# Patient Record
Sex: Female | Born: 1995 | Race: White | Hispanic: Yes | Marital: Single | Smoking: Never smoker
Health system: Southern US, Community
[De-identification: ages and names within clinical notes are randomized; demographics above are authoritative.]

## PROBLEM LIST (undated history)

## (undated) ENCOUNTER — Ambulatory Visit (HOSPITAL_COMMUNITY): Admission: EM | Payer: Self-pay

## (undated) DIAGNOSIS — Z7289 Other problems related to lifestyle: Secondary | ICD-10-CM

## (undated) DIAGNOSIS — F4310 Post-traumatic stress disorder, unspecified: Secondary | ICD-10-CM

## (undated) DIAGNOSIS — F322 Major depressive disorder, single episode, severe without psychotic features: Secondary | ICD-10-CM

## (undated) DIAGNOSIS — F410 Panic disorder [episodic paroxysmal anxiety] without agoraphobia: Secondary | ICD-10-CM

## (undated) DIAGNOSIS — H539 Unspecified visual disturbance: Secondary | ICD-10-CM

## (undated) DIAGNOSIS — T7422XA Child sexual abuse, confirmed, initial encounter: Secondary | ICD-10-CM

## (undated) DIAGNOSIS — F419 Anxiety disorder, unspecified: Secondary | ICD-10-CM

## (undated) DIAGNOSIS — J45909 Unspecified asthma, uncomplicated: Secondary | ICD-10-CM

## (undated) HISTORY — PX: WISDOM TOOTH EXTRACTION: SHX21

---
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
## 2017-01-14 ENCOUNTER — Encounter: Payer: Self-pay | Admitting: Obstetrics & Gynecology

## 2017-01-14 ENCOUNTER — Ambulatory Visit (INDEPENDENT_AMBULATORY_CARE_PROVIDER_SITE_OTHER): Payer: Medicaid Other | Admitting: Obstetrics & Gynecology

## 2017-01-14 VITALS — BP 117/76 | HR 81 | Ht 66.0 in | Wt 183.3 lb

## 2017-01-14 DIAGNOSIS — N83202 Unspecified ovarian cyst, left side: Secondary | ICD-10-CM | POA: Diagnosis not present

## 2017-01-14 DIAGNOSIS — Z975 Presence of (intrauterine) contraceptive device: Secondary | ICD-10-CM | POA: Insufficient documentation

## 2017-01-14 DIAGNOSIS — Z708 Other sex counseling: Secondary | ICD-10-CM

## 2017-01-14 NOTE — Patient Instructions (Addendum)
Ovarian Cyst  An ovarian cyst is a fluid-filled sac that forms on an ovary. The ovaries are small organs that produce eggs in women. Various types of cysts can form on the ovaries. Some may cause symptoms and require treatment. Most ovarian cysts go away on their own, are not cancerous (are benign), and do not cause problems. Common types of ovarian cysts include:  Functional (follicle) cysts.  Occur during the menstrual cycle, and usually go away with the next menstrual cycle if you do not get pregnant.  Usually cause no symptoms but can cause occasional pain.  No intervention needed unless they are recurrent.  Endometriomas.  Are cysts that form from the tissue that lines the uterus (endometrium).  Are sometimes called "chocolate cysts" because they become filled with blood that turns brown.  Can cause pain in the lower abdomen during intercourse and during your period.  Cystadenoma cysts.  Develop from cells on the outside surface of the ovary.  Can get very large and cause lower abdomen pain and pain with intercourse.  Can cause severe pain if they twist or break open (rupture).  Dermoid cysts.  Are sometimes found in both ovaries.  May contain different kinds of body tissue, such as skin, teeth, hair, or cartilage.  Usually do not cause symptoms unless they get very big.  Theca lutein cysts.  Occur when too much of a certain hormone (human chorionic gonadotropin) is produced and overstimulates the ovaries to produce an egg.  Are most common after having procedures used to assist with the conception of a baby (in vitro fertilization). What are the causes? Ovarian cysts may be caused by:  Ovarian hyperstimulation syndrome. This is a condition that can develop from taking fertility medicines. It causes multiple large ovarian cysts to form.  Polycystic ovarian syndrome (PCOS). This is a common hormonal disorder that can cause ovarian cysts, as well as problems with your  period or fertility. What increases the risk? The following factors may make you more likely to develop ovarian cysts:  Being overweight or obese.  Taking fertility medicines.  Taking certain forms of hormonal birth control.  Smoking. What are the signs or symptoms? Many ovarian cysts do not cause symptoms. If symptoms are present, they may include:  Pelvic pain or pressure.  Pain in the lower abdomen.  Pain during sex.  Abdominal swelling.  Abnormal menstrual periods.  Increasing pain with menstrual periods. How is this diagnosed? These cysts are commonly found during a routine pelvic exam. You may have tests to find out more about the cyst, such as:  Ultrasound.  X-ray of the pelvis.  CT scan.  MRI.  Blood tests. How is this treated? Many ovarian cysts go away on their own without treatment. Your health care provider may want to check your cyst regularly for 2-3 months to see if it changes. If you are in menopause, it is especially important to have your cyst monitored closely because menopausal women have a higher rate of ovarian cancer. When treatment is needed, it may include:  Medicines to help relieve pain.  A procedure to drain the cyst (aspiration).  Surgery to remove the whole cyst.  Hormone treatment or birth control pills. These methods are sometimes used to help dissolve a cyst. Follow these instructions at home:  Take over-the-counter and prescription medicines only as told by your health care provider.  Do not drive or use heavy machinery while taking prescription pain medicine.  Get regular pelvic exams and Pap tests  as often as told by your health care provider.  Return to your normal activities as told by your health care provider. Ask your health care provider what activities are safe for you.  Do not use any products that contain nicotine or tobacco, such as cigarettes and e-cigarettes. If you need help quitting, ask your health care  provider.  Keep all follow-up visits as told by your health care provider. This is important. Contact a health care provider if:  Your periods are late, irregular, or painful, or they stop.  You have pelvic pain that does not go away.  You have pressure on your bladder or trouble emptying your bladder completely.  You have pain during sex.  You have any of the following in your abdomen:  A feeling of fullness.  Pressure.  Discomfort.  Pain that does not go away.  Swelling.  You feel generally ill.  You become constipated.  You lose your appetite.  You develop severe acne.  You start to have more body hair and facial hair.  You are gaining weight or losing weight without changing your exercise and eating habits.  You think you may be pregnant. Get help right away if:  You have abdominal pain that is severe or gets worse.  You cannot eat or drink without vomiting.  You suddenly develop a fever.  Your menstrual period is much heavier than usual. This information is not intended to replace advice given to you by your health care provider. Make sure you discuss any questions you have with your health care provider. Document Released: 10/08/2005 Document Revised: 04/27/2016 Document Reviewed: 03/11/2016 Elsevier Interactive Patient Education  2017 Elsevier Inc.   HPV (Human Papillomavirus) Vaccine: What You Need to Know 1. Why get vaccinated? HPV vaccine prevents infection with human papillomavirus (HPV) types that are associated with many cancers, including:  cervical cancer in females,  vaginal and vulvar cancers in females,  anal cancer in females and males,  throat cancer in females and males, and  penile cancer in males. In addition, HPV vaccine prevents infection with HPV types that cause genital warts in both females and males. In the [place] about 12,000 women get cervical cancer every year, and about 4,000 women die from it. HPV vaccine can prevent  most of these cases of cervical cancer. Vaccination is not a substitute for cervical cancer screening. This vaccine does not protect against all HPV types that can cause cervical cancer. Women should still get regular Pap tests.  HPV infection usually comes from sexual contact, and most people will become infected at some point in their life. About 14 million Americans, including teens, get infected every year. Most infections will go away on their own and not cause serious problems. But thousands of women and men get cancer and other diseases from HPV. 2. HPV vaccine HPV vaccine is approved by FDA and is recommended by CDC for both males and females. It is routinely given at 6 or 19 years of age, but it may be given beginning at age 2 years through age 62 years. Most adolescents 9 through 19 years of age should get HPV vaccine as a two-dose series with the doses separated by 6-12 months. People who start HPV vaccination at 19 years of age and older should get the vaccine as a three-dose series with the second dose given 1-2 months after the first dose and the third dose given 6 months after the first dose. There are several exceptions to these age recommendations.  Your health care provider can give you more information. 3. Some people should not get this vaccine  Anyone who has had a severe (life-threatening) allergic reaction to a dose of HPV vaccine should not get another dose.  Anyone who has a severe (life threatening) allergy to any component of HPV vaccine should not get the vaccine.  Tell your doctor if you have any severe allergies that you know of, including a severe allergy to yeast.  HPV vaccine is not recommended for pregnant women. If you learn that you were pregnant when you were vaccinated, there is no reason to expect any problems for you or your baby. Any woman who learns she was pregnant when she got HPV vaccine is encouraged to contact the manufacturer's registry for HPV  vaccination during pregnancy at [phone]. Women who are breastfeeding may be vaccinated.  If you have a mild illness, such as a cold, you can probably get the vaccine today. If you are moderately or severely ill, you should probably wait until you recover. Your doctor can advise you. 4. Risks of a vaccine reaction With any medicine, including vaccines, there is a chance of side effects. These are usually mild and go away on their own, but serious reactions are also possible. Most people who get HPV vaccine do not have any serious problems with it. Mild or moderate problems following HPV vaccine:   Reactions in the arm where the shot was given:  Soreness (about 9 people in 10)  Redness or swelling (about 1 person in 3)  Fever:  Mild (100F) (about 1 person in 10)  Moderate (102F) (about 1 person in 65)  Other problems:  Headache (about 1 person in 3) Problems that could happen after any injected vaccine:   People sometimes faint after a medical procedure, including vaccination. Sitting or lying down for about 15 minutes can help prevent fainting, and injuries caused by a fall. Tell your doctor if you feel dizzy, or have vision changes or ringing in the ears.  Some people get severe pain in the shoulder and have difficulty moving the arm where a shot was given. This happens very rarely.  Any medication can cause a severe allergic reaction. Such reactions from a vaccine are very rare, estimated at about 1 in a million doses, and would happen within a few minutes to a few hours after the vaccination. As with any medicine, there is a very remote chance of a vaccine causing a serious injury or death. The safety of vaccines is always being monitored. For more information, visit: http://floyd.org/. 5. What if there is a serious reaction? What should I look for?  Look for anything that concerns you, such as signs of a severe allergic reaction, very high fever, or unusual  behavior. Signs of a severe allergic reaction can include hives, swelling of the face and throat, difficulty breathing, a fast heartbeat, dizziness, and weakness. These would usually start a few minutes to a few hours after the vaccination. What should I do?  If you think it is a severe allergic reaction or other emergency that can't wait, call 9-1-1 or get to the nearest hospital. Otherwise, call your doctor. Afterward, the reaction should be reported to the Vaccine Adverse Event Reporting System (VAERS). Your doctor should file this report, or you can do it yourself through the VAERS web site at www.vaers.LAgents.no, or by calling 1-(805) 028-9629. VAERS does not give medical advice.  6. The National Vaccine Injury Compensation Program The [employer] Vaccine Injury Compensation  Program (VICP) is a Stage manager that was created to compensate people who may have been injured by certain vaccines. Persons who believe they may have been injured by a vaccine can learn about the program and about filing a claim by calling 1-(413)267-5310 or visiting the VICP website at SpiritualWord.at. There is a time limit to file a claim for compensation. 7. How can I learn more?  Ask your health care provider. He or she can give you the vaccine package insert or suggest other sources of information.  Call your local or state health department.  Contact the Centers for Disease Control and Prevention (CDC):  Call (414)228-3553 (1-800-CDC-INFO) or  Visit CDC's website at RunningConvention.de Vaccine Information Statement, HPV Vaccine (09/23/2015) This information is not intended to replace advice given to you by your health care provider. Make sure you discuss any questions you have with your health care provider. Document Released: 05/05/2014 Document Revised: 06/28/2016 Document Reviewed: 06/28/2016 Elsevier Interactive Patient Education  2017 Elsevier Inc.   Gracias por inscribirse en MyChart. Siga  las instrucciones a continuacin para acceder de Wellsite geologist segura a su registro medico en lnea. MyChart le permite enviarle mensajes a su mdico, consultar los resultados de sus exmenes, Company secretary sus citas y mucho ms.  Cmo me inscribo? 1. En su navegador de Internet, vaya a Nurse, learning disability (barra de direcciones) e ingrese https://mychart.PackageNews.de. 2. Haga clic en el enlace de Sign Up Now (Registrarse ahora) en la casilla de Sign In (Inicio de sesin). Ver la pgina de New Member Sign Up (Registro de Cornish). 3. Ingrese su Access Code (cdigo de Teacher, adult education) de MyChart exactamente como aparece a continuacin. No tendr [employer] cdigo despus de completar el proceso de Engineer, maintenance (IT). Si no se registra antes de la fecha de vencimiento, debe solicitar un nuevo cdigo.  Cdigo de acceso de MyChart: [id number] Expires: 02/15/2017 12:38 AM  4. Ingrese su nmero de Seguro Social ([id number]) y fecha de nacimiento (mm/dd/aaa) como se indica y haga clic en  Submit Art gallery manager). Se le llevar a la siguiente pgina de registro. 5. Create a MyChart ID (Crear una identificacin de MyChart). Esta ser su identificacin de inicio de sesin de MyChart y no se puede cambiar, as que [employer] que sea segura y fcil de Clinical research associate. 6. Crear una contrasea de MyChart. Puede modificar su contrasea en cualquier momento. 7. Ingrese su pregunta y respuesta para restablecer su contrasea. Esto se puede usar ms adelante si olvida su contrasea.  8. Marcelino Freestone su direccin de correo electrnico. Recibir un correo electrnico cuando haya nueva informacin disponible en MyChart. 9. Haga clic en Sign Up (Registrarse). Ahora puede ver su registro mdico.  Informacin adicional Recuerde, MyChart NO se puede usar para necesidades urgentes. Para emergencias mdicas, marque el [phone].

## 2017-01-14 NOTE — Progress Notes (Signed)
Patient declines seeing Jamie today. 
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
## 2017-06-30 ENCOUNTER — Emergency Department (HOSPITAL_COMMUNITY)
Admission: EM | Admit: 2017-06-30 | Discharge: 2017-07-01 | Disposition: A | Discharge status: Home / Self Care | Payer: Medicaid Other | Attending: Emergency Medicine | Admitting: Emergency Medicine

## 2017-06-30 ENCOUNTER — Encounter (HOSPITAL_COMMUNITY): Payer: Self-pay | Admitting: *Deleted

## 2017-06-30 DIAGNOSIS — R0789 Other chest pain: Secondary | ICD-10-CM | POA: Diagnosis not present

## 2017-06-30 DIAGNOSIS — J45909 Unspecified asthma, uncomplicated: Secondary | ICD-10-CM | POA: Insufficient documentation

## 2017-06-30 DIAGNOSIS — R079 Chest pain, unspecified: Secondary | ICD-10-CM | POA: Diagnosis present

## 2017-06-30 DIAGNOSIS — Z87891 Personal history of nicotine dependence: Secondary | ICD-10-CM | POA: Diagnosis not present

## 2017-06-30 DIAGNOSIS — R059 Cough, unspecified: Secondary | ICD-10-CM

## 2017-06-30 DIAGNOSIS — R05 Cough: Secondary | ICD-10-CM | POA: Diagnosis not present

## 2017-06-30 NOTE — ED Triage Notes (Signed)
Pt c/o left chest pain under left breast that started while she was lying down; pt states she has some sob with the pain

## 2017-07-01 ENCOUNTER — Emergency Department (HOSPITAL_COMMUNITY): Payer: Medicaid Other

## 2017-07-01 LAB — COMPREHENSIVE METABOLIC PANEL
ALBUMIN: 4.2 g/dL (ref 3.5–5.0)
ALK PHOS: 87 U/L (ref 38–126)
ALT: 13 U/L — ABNORMAL LOW (ref 14–54)
ANION GAP: 11 (ref 5–15)
AST: 20 U/L (ref 15–41)
BILIRUBIN TOTAL: 0.2 mg/dL — AB (ref 0.3–1.2)
BUN: 11 mg/dL (ref 6–20)
CO2: 26 mmol/L (ref 22–32)
Calcium: 9.6 mg/dL (ref 8.9–10.3)
Chloride: 105 mmol/L (ref 101–111)
Creatinine, Ser: 0.62 mg/dL (ref 0.44–1.00)
GFR calc Af Amer: >60 mL/min (ref >60)
GFR calc non Af Amer: >60 mL/min (ref >60)
GLUCOSE: 143 mg/dL — AB (ref 65–99)
Potassium: 3.8 mmol/L (ref 3.5–5.1)
Sodium: 142 mmol/L (ref 135–145)
Total Protein: 7.4 g/dL (ref 6.5–8.1)

## 2017-07-01 LAB — CBC WITH DIFFERENTIAL/PLATELET
BASOS ABS: 0 10*3/uL (ref 0.0–0.1)
Basophils Relative: 0 %
Eosinophils Absolute: 0.1 10*3/uL (ref 0.0–0.7)
Eosinophils Relative: 1 %
HEMATOCRIT: 40.1 % (ref 36.0–46.0)
Hemoglobin: 13.2 g/dL (ref 12.0–15.0)
LYMPHS PCT: 45 %
Lymphs Abs: 4.7 10*3/uL — ABNORMAL HIGH (ref 0.7–4.0)
MCH: 29.6 pg (ref 26.0–34.0)
MCHC: 32.9 g/dL (ref 30.0–36.0)
MCV: 89.9 fL (ref 78.0–100.0)
MONO ABS: 0.5 10*3/uL (ref 0.1–1.0)
Monocytes Relative: 5 %
NEUTROS ABS: 5 10*3/uL (ref 1.7–7.7)
Neutrophils Relative %: 49 %
Platelets: 296 10*3/uL (ref 150–400)
RBC: 4.46 MIL/uL (ref 3.87–5.11)
RDW: 12.6 % (ref 11.5–15.5)
WBC: 10.4 10*3/uL (ref 4.0–10.5)

## 2017-07-01 LAB — D-DIMER, QUANTITATIVE: D-Dimer, Quant: 0.41 ug/mL-FEU (ref 0.00–0.50)

## 2017-07-01 LAB — TROPONIN I: Troponin I: <0.03 ng/mL (ref <0.03)

## 2017-07-01 MED ORDER — IPRATROPIUM-ALBUTEROL 0.5-2.5 (3) MG/3ML IN SOLN
3.0000 mL | Freq: Once | RESPIRATORY_TRACT | Status: AC
  Administered 2017-07-01: 3 mL via RESPIRATORY_TRACT
  Filled 2017-07-01: qty 3

## 2017-07-01 MED ORDER — ALBUTEROL SULFATE (2.5 MG/3ML) 0.083% IN NEBU
2.5000 mg | INHALATION_SOLUTION | Freq: Once | RESPIRATORY_TRACT | Status: AC
  Administered 2017-07-01: 2.5 mg via RESPIRATORY_TRACT
  Filled 2017-07-01: qty 3

## 2017-07-01 MED ORDER — ALBUTEROL SULFATE HFA 108 (90 BASE) MCG/ACT IN AERS
2.0000 | INHALATION_SPRAY | Freq: Four times a day (QID) | RESPIRATORY_TRACT | Status: DC | PRN
  Administered 2017-07-01: 2 via RESPIRATORY_TRACT
  Filled 2017-07-01: qty 6.7

## 2017-07-01 MED ORDER — IPRATROPIUM BROMIDE 0.02 % IN SOLN: 0.5000 mg | Freq: Once | RESPIRATORY_TRACT | Status: DC

## 2017-07-01 MED ORDER — ALBUTEROL SULFATE (2.5 MG/3ML) 0.083% IN NEBU: 5.0000 mg | INHALATION_SOLUTION | Freq: Once | RESPIRATORY_TRACT | Status: DC

## 2017-07-01 MED ORDER — IBUPROFEN 400 MG PO TABS
600.0000 mg | ORAL_TABLET | Freq: Once | ORAL | Status: AC
  Administered 2017-07-01: 600 mg via ORAL
  Filled 2017-07-01: qty 2

## 2017-07-01 MED ORDER — AEROCHAMBER Z-STAT PLUS/MEDIUM MISC
1.0000 | Freq: Once | Status: AC
  Administered 2017-07-01: 1

## 2017-07-01 NOTE — Discharge Instructions (Signed)
Use ice and heat for comfort. Take ibuprofen 600 mg 4 times a day for your chest wall pain as needed. Use the inhaler for shortness of breath. You can take mucinex DM OTC for cough.
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
## 2017-07-19 ENCOUNTER — Ambulatory Visit (INDEPENDENT_AMBULATORY_CARE_PROVIDER_SITE_OTHER): Payer: Medicaid Other | Admitting: Family Medicine

## 2017-07-19 ENCOUNTER — Encounter: Payer: Self-pay | Admitting: Family Medicine

## 2017-07-19 ENCOUNTER — Telehealth: Payer: Self-pay

## 2017-07-19 VITALS — BP 113/73 | HR 100 | Temp 98.2°F | Resp 14 | Ht 66.0 in | Wt 191.4 lb

## 2017-07-19 DIAGNOSIS — J4540 Moderate persistent asthma, uncomplicated: Secondary | ICD-10-CM | POA: Diagnosis not present

## 2017-07-19 DIAGNOSIS — Z23 Encounter for immunization: Secondary | ICD-10-CM | POA: Diagnosis not present

## 2017-07-19 MED ORDER — FLUTICASONE PROPIONATE (INHAL) 50 MCG/BLIST IN AEPB: 1.0000 | INHALATION_SPRAY | Freq: Two times a day (BID) | RESPIRATORY_TRACT | 12 refills | Status: DC

## 2017-07-19 MED ORDER — ALBUTEROL SULFATE (2.5 MG/3ML) 0.083% IN NEBU: 2.5000 mg | INHALATION_SOLUTION | Freq: Four times a day (QID) | RESPIRATORY_TRACT | 1 refills | Status: DC | PRN

## 2017-07-19 MED ORDER — BUDESONIDE-FORMOTEROL FUMARATE 80-4.5 MCG/ACT IN AERO: 2.0000 | INHALATION_SPRAY | Freq: Two times a day (BID) | RESPIRATORY_TRACT | 3 refills | Status: DC

## 2017-07-19 MED ORDER — PREDNISONE 20 MG PO TABS: 20.0000 mg | ORAL_TABLET | Freq: Every day | ORAL | 0 refills | Status: DC

## 2017-07-19 MED FILL — ?PREDNISONE 20MG TABLET: 20 | 10 days supply | Qty: 10 | Fill #0

## 2017-07-19 MED FILL — ALBUTEROL 0.083% INHAL SOLN: (2.5 MG/3ML | 15 days supply | Qty: 180 | Fill #0
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
## 2017-07-19 NOTE — Telephone Encounter (Signed)
Per Medicaid coverage list, Symbicort was preferred. Discontinued and prescribed fluticasone which is also listed as preferred.   Godfrey Pick. Tiburcio Pea, MSN, FNP-C [facility]  [address] [place], [place] [address] [phone]

## 2017-07-19 NOTE — Patient Instructions (Addendum)
Take all medication as directed! Follow-up in 2 weeks for evaluation of symptoms.   Asthma, Adult Asthma is a condition of the lungs in which the airways tighten and narrow. Asthma can make it hard to breathe. Asthma cannot be cured, but medicine and lifestyle changes can help control it. Asthma may be started (triggered) by:  Animal skin flakes (dander).  Dust.  Cockroaches.  Pollen.  Mold.  Smoke.  Cleaning products.  Hair sprays or aerosol sprays.  Paint fumes or strong smells.  Cold air, weather changes, and winds.  Crying or laughing hard.  Stress.  Certain medicines or drugs.  Foods, such as dried fruit, potato chips, and sparkling grape juice.  Infections or conditions (colds, flu).  Exercise.  Certain medical conditions or diseases.  Exercise or tiring activities.  Follow these instructions at home:  Take medicine as told by your doctor.  Use a peak flow meter as told by your doctor. A peak flow meter is a tool that measures how well the lungs are working.  Record and keep track of the peak flow meter's readings.  Understand and use the asthma action plan. An asthma action plan is a written plan for taking care of your asthma and treating your attacks.  To help prevent asthma attacks: ? Do not smoke. Stay away from secondhand smoke. ? Change your heating and air conditioning filter often. ? Limit your use of fireplaces and wood stoves. ? Get rid of pests (such as roaches and mice) and their droppings. ? Throw away plants if you see mold on them. ? Clean your floors. Dust regularly. Use cleaning products that do not smell. ? Have someone vacuum when you are not home. Use a vacuum cleaner with a HEPA filter if possible. ? Replace carpet with wood, tile, or vinyl flooring. Carpet can trap animal skin flakes and dust. ? Use allergy-proof pillows, mattress covers, and box spring covers. ? Wash bed sheets and blankets every week in hot water and dry them  in a dryer. ? Use blankets that are made of polyester or cotton. ? Clean bathrooms and kitchens with bleach. If possible, have someone repaint the walls in these rooms with mold-resistant paint. Keep out of the rooms that are being cleaned and painted. ? Wash hands often. Contact a doctor if:  You have make a whistling sound when breaking (wheeze), have shortness of breath, or have a cough even if taking medicine to prevent attacks.  The colored mucus you cough up (sputum) is thicker than usual.  The colored mucus you cough up changes from clear or white to yellow, green, gray, or bloody.  You have problems from the medicine you are taking such as: ? A rash. ? Itching. ? Swelling. ? Trouble breathing.  You need reliever medicines more than 2-3 times a week.  Your peak flow measurement is still at 50-79% of your personal best after following the action plan for 1 hour.  You have a fever. Get help right away if:  You seem to be worse and are not responding to medicine during an asthma attack.  You are short of breath even at rest.  You get short of breath when doing very little activity.  You have trouble eating, drinking, or talking.  You have chest pain.  You have a fast heartbeat.  Your lips or fingernails start to turn blue.  You are light-headed, dizzy, or faint.  Your peak flow is less than 50% of your personal best. This information  is not intended to replace advice given to you by your health care provider. Make sure you discuss any questions you have with your health care provider. Document Released: 03/26/2008 Document Revised: 03/15/2016 Document Reviewed: 05/07/2013 Elsevier Interactive Patient Education  2017 [employer].

## 2017-08-02 ENCOUNTER — Ambulatory Visit: Payer: Medicaid Other | Admitting: Family Medicine

## 2018-08-22 ENCOUNTER — Emergency Department (HOSPITAL_COMMUNITY)
Admission: EM | Admit: 2018-08-22 | Discharge: 2018-08-22 | Disposition: A | Discharge status: Home / Self Care | Payer: Self-pay | Attending: Emergency Medicine | Admitting: Emergency Medicine

## 2018-08-22 ENCOUNTER — Encounter (HOSPITAL_COMMUNITY): Payer: Self-pay | Admitting: Emergency Medicine

## 2018-08-22 ENCOUNTER — Emergency Department (HOSPITAL_COMMUNITY): Payer: Self-pay

## 2018-08-22 ENCOUNTER — Other Ambulatory Visit: Payer: Self-pay

## 2018-08-22 DIAGNOSIS — R070 Pain in throat: Secondary | ICD-10-CM | POA: Insufficient documentation

## 2018-08-22 DIAGNOSIS — R0789 Other chest pain: Secondary | ICD-10-CM | POA: Insufficient documentation

## 2018-08-22 DIAGNOSIS — J45909 Unspecified asthma, uncomplicated: Secondary | ICD-10-CM | POA: Insufficient documentation

## 2018-08-22 DIAGNOSIS — F1210 Cannabis abuse, uncomplicated: Secondary | ICD-10-CM | POA: Insufficient documentation

## 2018-08-22 DIAGNOSIS — R0981 Nasal congestion: Secondary | ICD-10-CM | POA: Insufficient documentation

## 2018-08-22 DIAGNOSIS — Z87891 Personal history of nicotine dependence: Secondary | ICD-10-CM | POA: Insufficient documentation

## 2018-08-22 DIAGNOSIS — R05 Cough: Secondary | ICD-10-CM | POA: Insufficient documentation

## 2018-08-22 DIAGNOSIS — R071 Chest pain on breathing: Secondary | ICD-10-CM | POA: Insufficient documentation

## 2018-08-22 DIAGNOSIS — F410 Panic disorder [episodic paroxysmal anxiety] without agoraphobia: Secondary | ICD-10-CM | POA: Insufficient documentation

## 2018-08-22 NOTE — Discharge Instructions (Signed)
You have been diagnosed by your caregiver as having chest wall pain. °SEEK IMMEDIATE MEDICAL ATTENTION IF: °You develop a fever.  °Your chest pains become severe or intolerable.  °You develop new, unexplained symptoms (problems).  °You develop shortness of breath, nausea, vomiting, sweating or feel light headed.  °You develop a new cough or you cough up blood. ° °

## 2018-08-22 NOTE — ED Notes (Signed)
Patient verbalizes understanding of discharge instructions. Opportunity for questioning and answers were provided. Pt discharged from ED. 

## 2018-08-22 NOTE — ED Triage Notes (Signed)
Patient here from home, states that she started having a panic attack at home and became short of breath.  She states she is feeling numb in hands, feet and legs and arms.  Patient was able to calm and now breathing with no distress.  BS clear bilaterally.

## 2018-08-22 NOTE — ED Notes (Signed)
ED Provider at bedside. 
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
## 2019-05-11 ENCOUNTER — Encounter (HOSPITAL_COMMUNITY): Payer: Self-pay

## 2019-05-11 ENCOUNTER — Emergency Department (HOSPITAL_COMMUNITY)
Admission: EM | Admit: 2019-05-11 | Discharge: 2019-05-11 | Disposition: A | Discharge status: Home / Self Care | Payer: Self-pay | Attending: Emergency Medicine | Admitting: Emergency Medicine

## 2019-05-11 ENCOUNTER — Ambulatory Visit (HOSPITAL_COMMUNITY): Admission: EM | Admit: 2019-05-11 | Discharge: 2019-05-11 | Discharge status: Against Medical Advice | Payer: Self-pay

## 2019-05-11 ENCOUNTER — Other Ambulatory Visit: Payer: Self-pay

## 2019-05-11 DIAGNOSIS — F32A Depression, unspecified: Secondary | ICD-10-CM

## 2019-05-11 DIAGNOSIS — Z87891 Personal history of nicotine dependence: Secondary | ICD-10-CM | POA: Insufficient documentation

## 2019-05-11 DIAGNOSIS — F329 Major depressive disorder, single episode, unspecified: Secondary | ICD-10-CM | POA: Insufficient documentation

## 2019-05-11 DIAGNOSIS — F4310 Post-traumatic stress disorder, unspecified: Secondary | ICD-10-CM | POA: Insufficient documentation

## 2019-05-11 DIAGNOSIS — F1210 Cannabis abuse, uncomplicated: Secondary | ICD-10-CM | POA: Insufficient documentation

## 2019-05-11 DIAGNOSIS — G4700 Insomnia, unspecified: Secondary | ICD-10-CM | POA: Insufficient documentation

## 2019-05-11 DIAGNOSIS — F321 Major depressive disorder, single episode, moderate: Secondary | ICD-10-CM | POA: Insufficient documentation

## 2019-05-11 DIAGNOSIS — F419 Anxiety disorder, unspecified: Secondary | ICD-10-CM | POA: Insufficient documentation

## 2019-05-11 LAB — I-STAT BETA HCG BLOOD, ED (MC, WL, AP ONLY): I-stat hCG, quantitative: <5 m[IU]/mL (ref <5)

## 2019-05-11 LAB — CBC
HCT: 42.1 % (ref 36.0–46.0)
Hemoglobin: 13.7 g/dL (ref 12.0–15.0)
MCH: 30.6 pg (ref 26.0–34.0)
MCHC: 32.5 g/dL (ref 30.0–36.0)
MCV: 94 fL (ref 80.0–100.0)
Platelets: 339 10*3/uL (ref 150–400)
RBC: 4.48 MIL/uL (ref 3.87–5.11)
RDW: 12.6 % (ref 11.5–15.5)
WBC: 7.5 10*3/uL (ref 4.0–10.5)
nRBC: 0 % (ref 0.0–0.2)

## 2019-05-11 LAB — ETHANOL: Alcohol, Ethyl (B): <10 mg/dL (ref <10)

## 2019-05-11 LAB — COMPREHENSIVE METABOLIC PANEL
ALT: 15 U/L (ref 0–44)
AST: 17 U/L (ref 15–41)
Albumin: 4.3 g/dL (ref 3.5–5.0)
Alkaline Phosphatase: 75 U/L (ref 38–126)
Anion gap: 9 (ref 5–15)
BUN: 11 mg/dL (ref 6–20)
CO2: 27 mmol/L (ref 22–32)
Calcium: 9.6 mg/dL (ref 8.9–10.3)
Chloride: 102 mmol/L (ref 98–111)
Creatinine, Ser: 0.69 mg/dL (ref 0.44–1.00)
GFR calc Af Amer: >60 mL/min (ref >60)
GFR calc non Af Amer: >60 mL/min (ref >60)
Glucose, Bld: 107 mg/dL — ABNORMAL HIGH (ref 70–99)
Potassium: 4.5 mmol/L (ref 3.5–5.1)
Sodium: 138 mmol/L (ref 135–145)
Total Bilirubin: 0.4 mg/dL (ref 0.3–1.2)
Total Protein: 6.9 g/dL (ref 6.5–8.1)

## 2019-05-11 LAB — RAPID URINE DRUG SCREEN, HOSP PERFORMED
Amphetamines: NOT DETECTED
Barbiturates: NOT DETECTED
Benzodiazepines: NOT DETECTED
Cocaine: NOT DETECTED
Opiates: NOT DETECTED
Tetrahydrocannabinol: NOT DETECTED

## 2019-05-11 LAB — ACETAMINOPHEN LEVEL: Acetaminophen (Tylenol), Serum: <10 ug/mL — ABNORMAL LOW (ref 10–30)

## 2019-05-11 LAB — SALICYLATE LEVEL: Salicylate Lvl: <7 mg/dL (ref 2.8–30.0)

## 2019-05-11 MED ORDER — HYDROXYZINE HCL 25 MG PO TABS: 25.0000 mg | ORAL_TABLET | Freq: Three times a day (TID) | ORAL | 0 refills | Status: DC

## 2019-05-11 NOTE — ED Triage Notes (Signed)
Pt reports increased anxiety and depression over the past 2 months. Pt used to take medications for these problems but has been off for about 2-3 years. Pt denies SI/HI, no AH/VH. Pt calm and cooperative in triage.
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
## 2019-05-11 NOTE — ED Notes (Signed)
Interpreter and TTS machine placed in room.
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
## 2019-05-11 NOTE — Discharge Instructions (Addendum)
You have been seen today for depression and anxiety. Please read and follow all provided instructions.   1. Medications: vistaril (for anxiety - this medication may cause drowsiness), usual home medications 2. Treatment: rest, drink plenty of fluids 3. Follow Up: Please follow up with your primary doctor in 2-7 days for discussion of your diagnoses and further evaluation after today's visit; if you do not have a primary care doctor use the resource guide provided to find one; Please return to the ER for any new or worsening symptoms. Please obtain all of your results from medical records or have your doctors office obtain the results - share them with your doctor - you should be seen at your doctors office. Call today to arrange your follow up.   Take medications as prescribed. Please review all of the medicines and only take them if you do not have an allergy to them. Return to the emergency room for worsening condition or new concerning symptoms. Follow up with your regular doctor. If you don't have a regular doctor use one of the numbers below to establish a primary care doctor.  Please be aware that if you are taking birth control pills, taking other prescriptions, ESPECIALLY ANTIBIOTICS may make the birth control ineffective - if this is the case, either do not engage in sexual activity or use alternative methods of birth control such as condoms until you have finished the medicine and your family doctor says it is OK to restart them. If you are on a blood thinner such as COUMADIN, be aware that any other medicine that you take may cause the coumadin to either work too much, or not enough - you should have your coumadin level rechecked in next 7 days if this is the case.  ?  It is also a possibility that you have an allergic reaction to any of the medicines that you have been prescribed - Everybody reacts differently to medications and while MOST people have no trouble with most medicines, you may  have a reaction such as nausea, vomiting, rash, swelling, shortness of breath. If this is the case, please stop taking the medicine immediately and contact your physician.  ?  You should return to the ER if you develop severe or worsening symptoms.   Emergency Department Resource Guide 1) Find a Doctor and Pay Out of Pocket Although you won't have to find out who is covered by your insurance plan, it is a good idea to ask around and get recommendations. You will then need to call the office and see if the doctor you have chosen will accept you as a new patient and what types of options they offer for patients who are self-pay. Some doctors offer discounts or will set up payment plans for their patients who do not have insurance, but you will need to ask so you aren't surprised when you get to your appointment.  2) Contact Your Local Health Department Not all health departments have doctors that can see patients for sick visits, but many do, so it is worth a call to see if yours does. If you don't know where your local health department is, you can check in your phone book. The CDC also has a tool to help you locate your state's health department, and many state websites also have listings of all of their local health departments.  3) Find a [facility] If your illness is not likely to be very severe or complicated, you may want to try  a walk in clinic. These are popping up all over the country in pharmacies, drugstores, and shopping centers. They're usually staffed by nurse practitioners or physician assistants that have been trained to treat common illnesses and complaints. They're usually fairly quick and inexpensive. However, if you have serious medical issues or chronic medical problems, these are probably not your best option.  No Primary Care Doctor: Call Health Connect at  [phone] - they can help you locate a primary care doctor that  accepts your insurance, provides certain services,  etc. Physician Referral Service- [phone]  Chronic Pain Problems: Organization         Address  Phone   Notes  [facility]  [phone] Patients need to be referred by their primary care doctor.   Medication Assistance: Organization         Address  Phone   Notes  [facility] Medication [facility] [address] [place], [place] [address] [phone] --Must be a resident of [facility] -- Must have NO insurance coverage whatsoever (no Medicaid/ Medicare, etc.) -- The pt. MUST have a primary care doctor that directs their care regularly and follows them in the community   [facility]  [phone]   [employer]  [phone]    Agencies that provide inexpensive medical care: Organization         Address  Phone   Notes  [place]  [phone]   [facility]    [phone]   [facility] [place], [place] [address] [phone]   [place] [address], [place] [phone]   [facility]    [phone]   [facility]    [phone]   [place] [address], [place] Phone:  [phone], Fax:  [phone] Hours of Operation:  9 am - 6 pm, M-F.  Also accepts Medicaid/Medicare and self-pay.  [facility] for [place] [address], [address], [place] Phone: [phone], Fax: [phone]. Hours of Operation:  8:30 am - 5:30 pm, M-F.  Also accepts Medicaid and self-pay.  [facility] [place] [address], [place] Phone: [phone]   [place], [place], [place] [phone], Ext. [phone] Mondays & Thursdays: 7-9 AM.  First 15 patients are seen on a first come, first serve basis.    [place] Providers:  Organization         Address  Phone   Notes  [facility] [address], [place] [phone] Also accepts self-pay patients.  [facility] [address], [place]  [phone]   [place], [address], [place] [phone]   [facility] [address], [place] [phone]   Lucianne Lei [address], [place]   [phone] Only accepts [place] patients after they have their name applied to their card.   Self-Pay (no insurance) in [facility]:  Organization         Address  Phone   Notes  Sickle Cell Patients, [facility] [place] [phone]   [facility] [place] [phone]   [facility] [place]  [place], [address], [place] [phone]   [facility]/Dr. Osei-Bonsu  [address] [place] or [address], [place] [phone] Phone number for both [place] and [place] locations is the same.  [facility] and [facility] [address], [place] [phone]   [facility] [address] [place] or [address] [phone]   [facility] [address] [place] [phone], phone; [phone], fax Sees patients 1st and 3rd Saturday of every month.  Must not qualify for public or private insurance (i.e. Medicaid, Medicare, [place] Health Choice, Veterans' Benefits)  Household income should be no more than 200% of the poverty level The clinic cannot treat you if you are pregnant or think you are pregnant  Sexually transmitted diseases are not treated at the clinic.

## 2019-07-04 ENCOUNTER — Emergency Department (HOSPITAL_COMMUNITY)
Admission: EM | Admit: 2019-07-04 | Discharge: 2019-07-05 | Disposition: A | Discharge status: Other Acute Inpatient Hospital | Payer: Self-pay | Attending: Emergency Medicine | Admitting: Emergency Medicine

## 2019-07-04 ENCOUNTER — Ambulatory Visit (HOSPITAL_COMMUNITY)
Admission: RE | Admit: 2019-07-04 | Discharge: 2019-07-04 | Disposition: A | Discharge status: Home / Self Care | Payer: Self-pay | Attending: Psychiatry | Admitting: Psychiatry

## 2019-07-04 ENCOUNTER — Encounter (HOSPITAL_COMMUNITY): Payer: Self-pay | Admitting: Emergency Medicine

## 2019-07-04 ENCOUNTER — Other Ambulatory Visit: Payer: Self-pay

## 2019-07-04 DIAGNOSIS — F332 Major depressive disorder, recurrent severe without psychotic features: Secondary | ICD-10-CM | POA: Insufficient documentation

## 2019-07-04 DIAGNOSIS — F4310 Post-traumatic stress disorder, unspecified: Secondary | ICD-10-CM | POA: Diagnosis present

## 2019-07-04 DIAGNOSIS — J45909 Unspecified asthma, uncomplicated: Secondary | ICD-10-CM | POA: Insufficient documentation

## 2019-07-04 DIAGNOSIS — F329 Major depressive disorder, single episode, unspecified: Secondary | ICD-10-CM | POA: Insufficient documentation

## 2019-07-04 DIAGNOSIS — R45851 Suicidal ideations: Secondary | ICD-10-CM | POA: Insufficient documentation

## 2019-07-04 DIAGNOSIS — Z3A01 Less than 8 weeks gestation of pregnancy: Secondary | ICD-10-CM | POA: Insufficient documentation

## 2019-07-04 DIAGNOSIS — Z20828 Contact with and (suspected) exposure to other viral communicable diseases: Secondary | ICD-10-CM | POA: Insufficient documentation

## 2019-07-04 DIAGNOSIS — F322 Major depressive disorder, single episode, severe without psychotic features: Secondary | ICD-10-CM | POA: Diagnosis present

## 2019-07-04 DIAGNOSIS — Z87891 Personal history of nicotine dependence: Secondary | ICD-10-CM | POA: Insufficient documentation

## 2019-07-04 DIAGNOSIS — F32A Depression, unspecified: Secondary | ICD-10-CM

## 2019-07-04 DIAGNOSIS — O99341 Other mental disorders complicating pregnancy, first trimester: Secondary | ICD-10-CM | POA: Insufficient documentation

## 2019-07-04 DIAGNOSIS — F419 Anxiety disorder, unspecified: Secondary | ICD-10-CM | POA: Insufficient documentation

## 2019-07-04 DIAGNOSIS — Z79899 Other long term (current) drug therapy: Secondary | ICD-10-CM | POA: Insufficient documentation

## 2019-07-04 LAB — COMPREHENSIVE METABOLIC PANEL
ALT: 15 U/L (ref 0–44)
AST: 14 U/L — ABNORMAL LOW (ref 15–41)
Albumin: 4.5 g/dL (ref 3.5–5.0)
Alkaline Phosphatase: 69 U/L (ref 38–126)
Anion gap: 13 (ref 5–15)
BUN: 10 mg/dL (ref 6–20)
CO2: 22 mmol/L (ref 22–32)
Calcium: 9.2 mg/dL (ref 8.9–10.3)
Chloride: 103 mmol/L (ref 98–111)
Creatinine, Ser: 0.64 mg/dL (ref 0.44–1.00)
GFR calc Af Amer: >60 mL/min (ref >60)
GFR calc non Af Amer: >60 mL/min (ref >60)
Glucose, Bld: 100 mg/dL — ABNORMAL HIGH (ref 70–99)
Potassium: 3.7 mmol/L (ref 3.5–5.1)
Sodium: 138 mmol/L (ref 135–145)
Total Bilirubin: 0.3 mg/dL (ref 0.3–1.2)
Total Protein: 7.5 g/dL (ref 6.5–8.1)

## 2019-07-04 LAB — CBC
HCT: 41.3 % (ref 36.0–46.0)
Hemoglobin: 13.3 g/dL (ref 12.0–15.0)
MCH: 30.3 pg (ref 26.0–34.0)
MCHC: 32.2 g/dL (ref 30.0–36.0)
MCV: 94.1 fL (ref 80.0–100.0)
Platelets: 312 10*3/uL (ref 150–400)
RBC: 4.39 MIL/uL (ref 3.87–5.11)
RDW: 12.8 % (ref 11.5–15.5)
WBC: 9.4 10*3/uL (ref 4.0–10.5)
nRBC: 0 % (ref 0.0–0.2)

## 2019-07-04 LAB — RAPID URINE DRUG SCREEN, HOSP PERFORMED
Amphetamines: NOT DETECTED
Barbiturates: NOT DETECTED
Benzodiazepines: NOT DETECTED
Cocaine: NOT DETECTED
Opiates: NOT DETECTED
Tetrahydrocannabinol: NOT DETECTED

## 2019-07-04 LAB — I-STAT BETA HCG BLOOD, ED (MC, WL, AP ONLY): I-stat hCG, quantitative: >2000 m[IU]/mL — ABNORMAL HIGH (ref <5)

## 2019-07-04 LAB — SARS CORONAVIRUS 2 BY RT PCR (HOSPITAL ORDER, PERFORMED IN ~~LOC~~ HOSPITAL LAB): SARS Coronavirus 2: NEGATIVE

## 2019-07-04 LAB — SALICYLATE LEVEL: Salicylate Lvl: <7 mg/dL (ref 2.8–30.0)

## 2019-07-04 LAB — ACETAMINOPHEN LEVEL: Acetaminophen (Tylenol), Serum: <10 ug/mL — ABNORMAL LOW (ref 10–30)

## 2019-07-04 LAB — ETHANOL: Alcohol, Ethyl (B): <10 mg/dL (ref <10)

## 2019-07-04 MED ORDER — ALBUTEROL SULFATE HFA 108 (90 BASE) MCG/ACT IN AERS: 1.0000 | INHALATION_SPRAY | Freq: Four times a day (QID) | RESPIRATORY_TRACT | Status: DC | PRN

## 2019-07-04 MED ORDER — PRENATAL MULTIVITAMIN CH
1.0000 | ORAL_TABLET | Freq: Every day | ORAL | Status: DC
  Administered 2019-07-05: 1 via ORAL
  Filled 2019-07-04: qty 1

## 2019-07-04 MED ORDER — HYDROXYZINE HCL 25 MG PO TABS
25.0000 mg | ORAL_TABLET | Freq: Three times a day (TID) | ORAL | Status: DC
  Administered 2019-07-04: 21:00:00 25 mg via ORAL
  Filled 2019-07-04 (×2): qty 1

## 2019-07-04 NOTE — H&P (Signed)
Behavioral Health Medical Screening Exam  Tiffany Davidson is an 23 y.o. female presents as voluntary walk-in accompanied by sister. [name] endorses Chronic SI, "It's always there," patient denies plan. Patient denies HI, denies AVH. Patient has hx of one previous inpatient admission at [facility]. Patient has hx cutting behaviors, denies recent cutting. Patient denies hx of suicide attempt. Patient states "I just found out I am pregnant one week ago." Patient stressors include, "I can't sleep, my parents moved to [place], I'm pregnant and I don't know what to do." Patient reports sister diagnosed with depression. Collateral from sister, Brayton Layman: "I am concerned about [name], we feel like we can't leave her alone. today she took three of her anxiety medication. That is why I brought her in."    Total Time spent with patient: 45 minutes  Psychiatric Specialty Exam: Physical Exam  Nursing note and vitals reviewed. Constitutional: She is oriented to person, place, and time. She appears well-developed.  HENT:  Head: Normocephalic.  Cardiovascular: Normal rate.  Respiratory: Effort normal.  Neurological: She is alert and oriented to person, place, and time.  Psychiatric: Her speech is normal and behavior is normal. Judgment normal. Cognition and memory are normal. She exhibits a depressed mood. She expresses suicidal ideation.    Review of Systems  Constitutional: Negative.   HENT: Negative.   Eyes: Negative.   Respiratory: Negative.   Cardiovascular: Negative.   Gastrointestinal: Negative.   Genitourinary: Negative.   Musculoskeletal: Negative.   Skin: Negative.   Neurological: Negative.   Endo/Heme/Allergies: Negative.     Blood pressure 110/63, pulse 96, temperature 97.8 F (36.6 C), temperature source Oral, resp. rate 16, SpO2 100 %.There is no height or weight on file to calculate BMI.  General Appearance: Casual  Eye Contact:  Minimal  Speech:  Clear  and Coherent  Volume:  Decreased  Mood:  Depressed  Affect:  Depressed  Thought Process:  Coherent and Descriptions of Associations: Intact  Orientation:  Full (Time, Place, and Person)  Thought Content:  Logical  Suicidal Thoughts:  Yes.  without intent/plan  Homicidal Thoughts:  No  Memory:  Immediate;   Good Recent;   Good Remote;   Good  Judgement:  Impaired  Insight:  Lacking  Psychomotor Activity:  Normal  Concentration: Concentration: Good and Attention Span: Good  Recall:  Good  Fund of Knowledge:Fair  Language: Good  Akathisia:  No  Handed:  Right  AIMS (if indicated):     Assets:  Communication Skills Desire for [place]  Sleep:       Musculoskeletal: Strength & Muscle Tone: within normal limits Gait & Station: normal Patient leans: N/A  Blood pressure 110/63, pulse 96, temperature 97.8 F (36.6 C), temperature source Oral, resp. rate 16, SpO2 100 %.  Recommendations:  Based on my evaluation the patient appears to have an emergency medical condition for which I recommend the patient be transferred to the emergency department for further evaluation.  Emmaline Kluver, FNP 07/04/2019, 5:47 PM

## 2019-07-04 NOTE — ED Notes (Signed)
Pt. Had one white bag and a paperbag with her name on it. Put into locker 28. Pt. Only has slippers in her room with her. As well as 2 bracelets on her right arm.

## 2019-07-04 NOTE — ED Notes (Signed)
Pt. Has changed into burgundy scrubs.

## 2019-07-04 NOTE — ED Triage Notes (Signed)
Patient here from [facility] with complaints of SI "all the time". Pregnant and parents moved to [place]. Anxiety. Hx of cutting.
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
## 2019-07-04 NOTE — ED Notes (Signed)
Dr. Plunkett at bedside at this time.  

## 2019-07-04 NOTE — BH Assessment (Signed)
[facility] Assessment Progress Note Case was staffed with Hall Busing NP who recommended inpatient admission to assist with stabilization.
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
## 2019-07-05 ENCOUNTER — Encounter (HOSPITAL_COMMUNITY): Payer: Self-pay

## 2019-07-05 ENCOUNTER — Inpatient Hospital Stay (HOSPITAL_COMMUNITY)
Admission: AD | Admit: 2019-07-05 | Discharge: 2019-07-08 | DRG: 832 | Disposition: A | Discharge status: Home / Self Care | Payer: Federal, State, Local not specified - Other | Source: Intra-hospital | Attending: Psychiatry | Admitting: Psychiatry

## 2019-07-05 DIAGNOSIS — Z20828 Contact with and (suspected) exposure to other viral communicable diseases: Secondary | ICD-10-CM | POA: Diagnosis present

## 2019-07-05 DIAGNOSIS — Z3A01 Less than 8 weeks gestation of pregnancy: Secondary | ICD-10-CM

## 2019-07-05 DIAGNOSIS — O99341 Other mental disorders complicating pregnancy, first trimester: Secondary | ICD-10-CM | POA: Diagnosis present

## 2019-07-05 DIAGNOSIS — G4700 Insomnia, unspecified: Secondary | ICD-10-CM | POA: Diagnosis present

## 2019-07-05 DIAGNOSIS — R45851 Suicidal ideations: Secondary | ICD-10-CM | POA: Diagnosis present

## 2019-07-05 DIAGNOSIS — F4310 Post-traumatic stress disorder, unspecified: Secondary | ICD-10-CM | POA: Diagnosis present

## 2019-07-05 DIAGNOSIS — F332 Major depressive disorder, recurrent severe without psychotic features: Secondary | ICD-10-CM | POA: Diagnosis not present

## 2019-07-05 DIAGNOSIS — Z87891 Personal history of nicotine dependence: Secondary | ICD-10-CM | POA: Diagnosis not present

## 2019-07-05 MED ORDER — SERTRALINE HCL 50 MG PO TABS
50.0000 mg | ORAL_TABLET | Freq: Every day | ORAL | Status: DC
  Filled 2019-07-05 (×2): qty 1

## 2019-07-05 MED ORDER — ALUM & MAG HYDROXIDE-SIMETH 200-200-20 MG/5ML PO SUSP
30.0000 mL | ORAL | Status: DC | PRN
  Administered 2019-07-07: 30 mL via ORAL
  Filled 2019-07-05: qty 30

## 2019-07-05 MED ORDER — ALBUTEROL SULFATE HFA 108 (90 BASE) MCG/ACT IN AERS: 1.0000 | INHALATION_SPRAY | Freq: Four times a day (QID) | RESPIRATORY_TRACT | Status: DC | PRN

## 2019-07-05 MED ORDER — MAGNESIUM HYDROXIDE 400 MG/5ML PO SUSP: 30.0000 mL | Freq: Every day | ORAL | Status: DC | PRN

## 2019-07-05 MED ORDER — SERTRALINE HCL 50 MG PO TABS
50.0000 mg | ORAL_TABLET | Freq: Every day | ORAL | Status: DC
  Administered 2019-07-05 – 2019-07-08 (×4): 50 mg via ORAL
  Filled 2019-07-05 (×7): qty 1
  Filled 2019-07-05: qty 7

## 2019-07-05 MED ORDER — DIPHENHYDRAMINE HCL 25 MG PO CAPS
25.0000 mg | ORAL_CAPSULE | Freq: Three times a day (TID) | ORAL | Status: DC | PRN
  Administered 2019-07-05: 25 mg via ORAL
  Filled 2019-07-05: qty 1

## 2019-07-05 MED ORDER — PRENATAL MULTIVITAMIN CH
1.0000 | ORAL_TABLET | Freq: Every day | ORAL | Status: DC
  Administered 2019-07-06 – 2019-07-07 (×2): 1 via ORAL
  Filled 2019-07-05 (×2): qty 1
  Filled 2019-07-05: qty 7
  Filled 2019-07-05: qty 1

## 2019-07-05 MED ORDER — ACETAMINOPHEN 325 MG PO TABS: 650.0000 mg | ORAL_TABLET | Freq: Four times a day (QID) | ORAL | Status: DC | PRN

## 2019-07-05 NOTE — Progress Notes (Signed)
Patient is a 23 year old female who presented voluntarily to [facility] for increasing depression, feelings of worthlessness and SI ( no plan). Pt has a hx of MDD, PTSD, and cutting. Pt presented today with an anxious affect/ depressed mood- calm and cooperative behavior- answered questions logically and coherently throughout admission interview. Pt currently denies pain, SI/HI and A/V hallucinations. VS obtained. Admission paperwork completed and signed- verbally expressed understanding. Skin assessment revealed no abnormalities- belongings searched and secured in locker. Patient oriented to unit. Q 15 min checks initiated for safety. Pt provided with toiletries and personal pitcher of water.

## 2019-07-05 NOTE — ED Notes (Signed)
Pt to room 37.oriented to unit. Pt calm, cooperative, no s/s of distress. Pt denied SI/HI.

## 2019-07-05 NOTE — Progress Notes (Signed)
Adult Psychoeducational Group Note  Date:  07/05/2019 Time:  8:39 PM  Group Topic/Focus:  Wrap-Up Group:   The focus of this group is to help patients review their daily goal of treatment and discuss progress on daily workbooks.  Participation Level:  Active  Participation Quality:  Appropriate  Affect:  Appropriate  Cognitive:  Appropriate  Insight: Appropriate  Engagement in Group:  Engaged  Modes of Intervention:  Discussion  Additional Comments:  Patient attended group and said that her day was a 5. Her coping skills were sleeping and reading.   Boniface Goffe W Izaih Kataoka 0/30/0923, 8:39 PM
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
## 2019-07-05 NOTE — ED Notes (Signed)
2 Pt Belonging Bags [1 [employer] & 1 [employer] paper Bag]

## 2019-07-05 NOTE — ED Notes (Signed)
Pt resting comfortably in bed

## 2019-07-05 NOTE — Progress Notes (Signed)
This patient has been accepted to [facility] for inpatient psychiatric admission.  Accepting Provider: Dr.Cobos  Bed: 631-4  RN Call for Report: [phone]  This patient is currently voluntary, she may arrive after 1:00pm via [place], [place] Social Worker [phone]

## 2019-07-05 NOTE — ED Notes (Signed)
Pt off unit to [facility] per provider. Pt alert, calm,cooperative, no s/s of distress. DC information given to [place] driver for [facility] facility. Belongings given to [employer] driver for facility. Pt ambulatory off the unit, escorted by [facility]. Pt transported by [employer]

## 2019-07-05 NOTE — Progress Notes (Signed)
This patient continues to meet inpatient criteria. CSW faxed information to the following facilities:   [facility]- no beds available, will review [facility]- potential bed availability, will review [facility] reviewing for appropriate bed availability.  Stephanie Acre, LCSW-A Clinical Social Worker

## 2019-07-05 NOTE — Tx Team (Signed)
Initial Treatment Plan 07/05/2019 3:12 PM [name] EGB:[id number]    PATIENT STRESSORS: Traumatic event Other: pregnancy    PATIENT STRENGTHS: Average or above average intelligence Communication skills Motivation for treatment/growth Physical Health Supportive family/friends   PATIENT IDENTIFIED PROBLEMS:        "overwhelmed"     "feeling worthless"     " is nervous about everything"       DISCHARGE CRITERIA:  Improved stabilization in mood, thinking, and/or behavior Reduction of life-threatening or endangering symptoms to within safe limits Verbal commitment to aftercare and medication compliance  PRELIMINARY DISCHARGE PLAN: Outpatient therapy Return to previous living arrangement Return to previous work or school arrangements  PATIENT/FAMILY INVOLVEMENT: This treatment plan has been presented to and reviewed with the patient, Tiffany Davidson, The patient has been given the opportunity to ask questions and make suggestions.  Waymond Cera, RN 07/05/2019, 3:12 PM
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
## 2019-07-06 ENCOUNTER — Telehealth: Payer: Self-pay | Admitting: Obstetrics & Gynecology

## 2019-07-06 LAB — TSH: TSH: 1.232 u[IU]/mL (ref 0.350–4.500)

## 2019-07-06 MED ORDER — HYDROXYZINE HCL 25 MG PO TABS
25.0000 mg | ORAL_TABLET | Freq: Three times a day (TID) | ORAL | Status: DC | PRN
  Administered 2019-07-06 – 2019-07-07 (×2): 25 mg via ORAL
  Filled 2019-07-06 (×3): qty 1

## 2019-07-06 NOTE — Progress Notes (Signed)
D.  Pt appears depressed on approach, no complaints voiced at this time.  Pt did attend evening wrap up group with appropriate participation.  Pt denies SI/HI/AVH at this time, but is tearful.  Pt requested medication for sleep.  A.  Support and encouragement offered, medication given as ordered  R.  Pt remains safe on the unit, will continue to monitor.
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
## 2019-07-06 NOTE — Tx Team (Signed)
Interdisciplinary Treatment and Diagnostic Plan Update  07/06/2019 Time of Session: 9:00am Tiffany Davidson MRN: [id number]  Principal Diagnosis: <principal problem not specified>  Secondary Diagnoses: Active Problems:   Post traumatic stress disorder (PTSD)   Current Medications:  Current Facility-Administered Medications  Medication Dose Route Frequency Provider Last Rate Last Dose  . acetaminophen (TYLENOL) tablet 650 mg  650 mg Oral Q6H PRN Ethelene Hal, NP      . albuterol (VENTOLIN HFA) 108 (90 Base) MCG/ACT inhaler 1-2 puff  1-2 puff Inhalation Q6H PRN Ethelene Hal, NP      . alum & mag hydroxide-simeth (MAALOX/MYLANTA) 200-200-20 MG/5ML suspension 30 mL  30 mL Oral Q4H PRN Ethelene Hal, NP      . diphenhydrAMINE (BENADRYL) capsule 25 mg  25 mg Oral Q8H PRN Cobos, Myer Peer, MD   25 mg at 07/05/19 2110  . magnesium hydroxide (MILK OF MAGNESIA) suspension 30 mL  30 mL Oral Daily PRN Ethelene Hal, NP      . prenatal multivitamin tablet 1 tablet  1 tablet Oral Q1200 Ethelene Hal, NP      . sertraline (ZOLOFT) tablet 50 mg  50 mg Oral Daily Cobos, Myer Peer, MD   50 mg at 07/06/19 0756   PTA Medications: No medications prior to admission.    Patient Stressors: Traumatic event Other: pregnancy   Patient Strengths: Average or above average intelligence Communication skills Motivation for treatment/growth Physical Health Supportive family/friends  Treatment Modalities: Medication Management, Group therapy, Case management,  1 to 1 session with clinician, Psychoeducation, Recreational therapy.   Physician Treatment Plan for Primary Diagnosis: <principal problem not specified> Long Term Goal(s): Improvement in symptoms so as ready for discharge Improvement in symptoms so as ready for discharge   Short Term Goals: Ability to identify changes in lifestyle to reduce recurrence of condition will improve Ability to verbalize  feelings will improve Ability to disclose and discuss suicidal ideas Ability to demonstrate self-control will improve Ability to identify and develop effective coping behaviors will improve Ability to maintain clinical measurements within normal limits will improve Ability to identify changes in lifestyle to reduce recurrence of condition will improve Ability to verbalize feelings will improve Ability to disclose and discuss suicidal ideas Ability to demonstrate self-control will improve Ability to identify and develop effective coping behaviors will improve Ability to maintain clinical measurements within normal limits will improve  Medication Management: Evaluate patient's response, side effects, and tolerance of medication regimen.  Therapeutic Interventions: 1 to 1 sessions, Unit Group sessions and Medication administration.  Evaluation of Outcomes: Not Met  Physician Treatment Plan for Secondary Diagnosis: Active Problems:   Post traumatic stress disorder (PTSD)  Long Term Goal(s): Improvement in symptoms so as ready for discharge Improvement in symptoms so as ready for discharge   Short Term Goals: Ability to identify changes in lifestyle to reduce recurrence of condition will improve Ability to verbalize feelings will improve Ability to disclose and discuss suicidal ideas Ability to demonstrate self-control will improve Ability to identify and develop effective coping behaviors will improve Ability to maintain clinical measurements within normal limits will improve Ability to identify changes in lifestyle to reduce recurrence of condition will improve Ability to verbalize feelings will improve Ability to disclose and discuss suicidal ideas Ability to demonstrate self-control will improve Ability to identify and develop effective coping behaviors will improve Ability to maintain clinical measurements within normal limits will improve     Medication Management: Evaluate  patient's response,  side effects, and tolerance of medication regimen.  Therapeutic Interventions: 1 to 1 sessions, Unit Group sessions and Medication administration.  Evaluation of Outcomes: Not Met   RN Treatment Plan for Primary Diagnosis: <principal problem not specified> Long Term Goal(s): Knowledge of disease and therapeutic regimen to maintain health will improve  Short Term Goals: Ability to verbalize feelings will improve, Ability to disclose and discuss suicidal ideas, Ability to identify and develop effective coping behaviors will improve and Compliance with prescribed medications will improve  Medication Management: RN will administer medications as ordered by provider, will assess and evaluate patient's response and provide education to patient for prescribed medication. RN will report any adverse and/or side effects to prescribing provider.  Therapeutic Interventions: 1 on 1 counseling sessions, Psychoeducation, Medication administration, Evaluate responses to treatment, Monitor vital signs and CBGs as ordered, Perform/monitor CIWA, COWS, AIMS and Fall Risk screenings as ordered, Perform wound care treatments as ordered.  Evaluation of Outcomes: Not Met   LCSW Treatment Plan for Primary Diagnosis: <principal problem not specified> Long Term Goal(s): Safe transition to appropriate next level of care at discharge, Engage patient in therapeutic group addressing interpersonal concerns.  Short Term Goals: Engage patient in aftercare planning with referrals and resources, Increase social support, Increase ability to appropriately verbalize feelings, Increase emotional regulation and Increase skills for wellness and recovery  Therapeutic Interventions: Assess for all discharge needs, 1 to 1 time with Social worker, Explore available resources and support systems, Assess for adequacy in community support network, Educate family and significant other(s) on suicide prevention, Complete  Psychosocial Assessment, Interpersonal group therapy.  Evaluation of Outcomes: Not Met   Progress in Treatment: Attending groups: No. Participating in groups: No. Taking medication as prescribed: Yes. Toleration medication: Yes. Family/Significant other contact made: No, will contact:  supports if consents are granted Patient understands diagnosis: Yes. Discussing patient identified problems/goals with staff: Yes. Medical problems stabilized or resolved: Yes. Denies suicidal/homicidal ideation: No. Issues/concerns per patient self-inventory: Yes.  New problem(s) identified: Yes, Describe:  limited social supports, limited income  New Short Term/Long Term Goal(s): medication management for mood stabilization; elimination of SI thoughts; development of comprehensive mental wellness/sobriety plan.  Patient Goals: Refused to attend treatment team, did not state a goal.   Discharge Plan or Barriers: CSW assessing for appropriate referrals.   Reason for Continuation of Hospitalization: Anxiety Depression Suicidal ideation  Estimated Length of Stay: 3-5 days  Attendees: Patient: Tiffany Davidson 07/06/2019 9:05 AM  Physician: Queen Blossom 07/06/2019 9:05 AM  Nursing: Elberta Fortis, RN 07/06/2019 9:05 AM  RN Care Manager: 07/06/2019 9:05 AM  Social Worker: Stephanie Acre, [place] 07/06/2019 9:05 AM  Recreational Therapist:  07/06/2019 9:05 AM  Other: Harriett Sine, NP 07/06/2019 9:05 AM  Other:  07/06/2019 9:05 AM  Other: 07/06/2019 9:05 AM    Scribe for Treatment Team: [name], [place] 07/06/2019 10:13 AM

## 2019-07-06 NOTE — Telephone Encounter (Signed)
Behavorial health called to schedule the New ob intake for the patient. Proof of pregnancy is on file via a blood test. No lmp or EDD. The assistant stated the lmp was about 3 weeks ago. She stated the patient was unavailable to speak with me about the appointment. She is currently hospitalized.

## 2019-07-06 NOTE — Progress Notes (Signed)
Patient ID: Tiffany Davidson, female   DOB: [birth year], 23 y.o.   MRN: [id number]  [place] NOVEL CORONAVIRUS (COVID-19) DAILY CHECK-OFF SYMPTOMS - answer yes or no to each - every day NO YES  Have you had a fever in the past 24 hours?  . Fever (Temp > 37.80C / 100F) X   Have you had any of these symptoms in the past 24 hours? . New Cough .  Sore Throat  .  Shortness of Breath .  Difficulty Breathing .  Unexplained Body Aches   X   Have you had any one of these symptoms in the past 24 hours not related to allergies?   . Runny Nose .  Nasal Congestion .  Sneezing   X   If you have had runny nose, nasal congestion, sneezing in the past 24 hours, has it worsened?  X   EXPOSURES - check yes or no X   Have you traveled outside the state in the past 14 days?  X   Have you been in contact with someone with a confirmed diagnosis of COVID-19 or PUI in the past 14 days without wearing appropriate PPE?  X   Have you been living in the same home as a person with confirmed diagnosis of COVID-19 or a PUI (household contact)?    X   Have you been diagnosed with COVID-19?    X              What to do next: Answered NO to all: Answered YES to anything:   Proceed with unit schedule Follow the [facility] Inpatient Flowsheet.

## 2019-07-06 NOTE — Plan of Care (Signed)
  Problem: Coping: Goal: Ability to demonstrate self-control will improve Outcome: Progressing   Problem: Safety: Goal: Periods of time without injury will increase Outcome: Progressing   

## 2019-07-06 NOTE — Progress Notes (Signed)
The patient's positive event for the day is that she had a good talk with her parents. Her goal for tomorrow is to sleep less during the daytime.

## 2019-07-06 NOTE — Progress Notes (Signed)
Recreation Therapy Notes  Date:  9.14.20 Time: 0930 Location: 400 Hall Dayroom  Group Topic: Stress Management  Goal Area(s) Addresses:  Patient will identify positive stress management techniques. Patient will identify benefits of using stress management post d/c.  Intervention: Stress Management  Activity :  Meditation.  LRT introduced the stress management technique of meditation.  LRT played a meditation that focused on making the most of your day.  Patients were to follow along as meditation played to engage in activity.  Education:  Stress Management, Discharge Planning.   Education Outcome: Acknowledges Education  Clinical Observations/Feedback:  Pt did not attend group.    Anuel Sitter, LRT/CTRS         Rajvi Armentor A 07/06/2019 10:04 AM 

## 2019-07-07 LAB — HEMOGLOBIN A1C
Hgb A1c MFr Bld: 5.5 % (ref 4.8–5.6)
Mean Plasma Glucose: 111 mg/dL

## 2019-07-07 NOTE — Progress Notes (Signed)
Spiritual care group on grief and loss facilitated by chaplain Jerene Pitch  Group Goal:  Support / Education around grief and loss Members engage in facilitated group support and psycho-social education.  Group Description:  Following introductions and group rules, group members engaged in facilitated group dialog and support around topic of loss, with particular support around experiences of loss in their lives. Group Identified types of loss (relationships / self / things) and identified patterns, circumstances, and changes that precipitate losses. Reflected on thoughts / feelings around loss, normalized grief responses, and recognized variety in grief experience. Patient Progress:  Present throughout group.  Did not engage in group discussion.
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
## 2019-07-07 NOTE — Progress Notes (Signed)
Patient ID: Tiffany Davidson, female   DOB: [birth year], 23 y.o.   MRN: [id number]  Pt chose not to attend goals/orientation group with the MHT.

## 2019-07-07 NOTE — [facility] Suicide Risk Assessment (Signed)
[place] INPATIENT:  Family/Significant Other Suicide Prevention Education  Suicide Prevention Education:  Education Completed; with sister, Tiffany Davidson [phone] has been identified by the patient as the family member/significant other with whom the patient will be residing, and identified as the person(s) who will aid the patient in the event of a mental health crisis (suicidal ideations/suicide attempt).  With written consent from the patient, the family member/significant other has been provided the following suicide prevention education, prior to the and/or following the discharge of the patient.  The suicide prevention education provided includes the following:  Suicide risk factors  Suicide prevention and interventions  National Suicide Hotline telephone number  [facility] assessment telephone number  [facility] Emergency Assistance [place] and/or Residential Mobile Crisis Unit telephone number  Request made of family/significant other to:  Remove weapons (e.g., guns, rifles, knives), all items previously/currently identified as safety concern.    Remove drugs/medications (over-the-counter, prescriptions, illicit drugs), all items previously/currently identified as a safety concern.  The family member/significant other verbalizes understanding of the suicide prevention education information provided.  The family member/significant other agrees to remove the items of safety concern listed above.  Patient lives with her sister, Tiffany Davidson. Sister is supportive and asked questions regarding patient's progress and her readiness to discharge. Sister has no specific safety concerns regarding a potential discharge within the next 24-48 hours, she hopes the patient will be "ready" and will not slip back into her depression.  No additional questions or concerns at this time, CSW provided callback information to sister, should questions or concerns arise prior to  discharge.  Tiffany Davidson 07/07/2019, 3:49 PM

## 2019-07-07 NOTE — Progress Notes (Signed)
DAR NOTE: Patient presents with flat affect and depressed mood.  Denies suicidal thoughts, pain, auditory and visual hallucinations.  Described energy level as normal and concentration as good.  Rates depression at 7, hopelessness at 4, and anxiety at 4.  Maintained on routine safety checks.  Medications given as prescribed.  Support and encouragement offered as needed.  Attended group and participated.  States goal for today is "to go home."  Patient visible in milieu interacting with peers.  Patient is safe on and off the unit.  Offered no complaint.
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
## 2019-07-07 NOTE — Progress Notes (Signed)
Patient rated her day as a 6 out of a possible 10. She states that she was upset earlier in the day as she watched her peers getting discharged. Her goal for tomorrow is to go home.

## 2019-07-08 DIAGNOSIS — F332 Major depressive disorder, recurrent severe without psychotic features: Secondary | ICD-10-CM

## 2019-07-08 MED ORDER — SERTRALINE HCL 50 MG PO TABS: 50.0000 mg | ORAL_TABLET | Freq: Every day | ORAL | 0 refills | Status: AC

## 2019-07-08 MED ORDER — HYDROXYZINE HCL 25 MG PO TABS: 25.0000 mg | ORAL_TABLET | Freq: Three times a day (TID) | ORAL | 0 refills | Status: AC | PRN

## 2019-07-08 MED ORDER — PRENATAL MULTIVITAMIN CH: 1.0000 | ORAL_TABLET | Freq: Every day | ORAL | Status: AC

## 2019-07-08 NOTE — Progress Notes (Signed)
  [name] Adult Case Management Discharge Plan :  Will you be returning to the same living situation after discharge:  Yes,  home At discharge, do you have transportation home?: Yes,  sister is picking up Do you have the ability to pay for your medications: No. Referred to community mental health and provided samples.   Release of information consent forms completed and in the chart. Work Quarry manager on chart. Patient to Follow up at: Follow-up [place] for [facility] Follow up on 08/19/2019.   Why: Please attend your intake appointment on Wednesday, 10/28 at 9:30a.  Be sure to bring your photo ID and any medications you are on.   Next appointment with the provider is Wednesday, 11/25 at 8:35a. Contact information: [address]    [address] ph: [phone] fx:        Tiffany Davidson Follow up on 07/15/2019.   Why: Telephonic hosptial follow up appointment is Wendesday, 9/23 at 10:30a.  The provider will contact you.  Contact information: [address] [place] [address] [phone]           Next level of care provider has access to [place] and Suicide Prevention discussed: Yes,  with sister, Tiffany Davidson     Has patient been referred to the Quitline?: N/A patient is not a smoker  Patient has been referred for addiction treatment: Yes  Tiffany Davidson, [place] 07/08/2019, 10:34 AM
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
## 2019-07-08 NOTE — Progress Notes (Signed)
D:  Patient denied SI and HI, contracts for safety.  Denied A/V hallucinations.  Denied pain. A:  Medications administered per MD orders.  Emotional support and encouragement given patient. R:  Safety maintained with 15 minute checks.  

## 2019-07-08 NOTE — Progress Notes (Signed)
Discharge Note:  Patient discharged home with family member.  Patient denied SI and HI, contracts for safety.  Denied A/V hallucinations.  Suicide prevention information given and discussed with patient who stated she understood and had no questions.    Patient stated she received all her belongings, clothing, toiletries, misc items, etc.  Patient stated she appreciated all assistance received from [facility] staff.  All required discharge information given to patient at discharge.  
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
## 2019-08-19 ENCOUNTER — Ambulatory Visit: Payer: Self-pay | Admitting: *Deleted

## 2019-08-19 ENCOUNTER — Other Ambulatory Visit: Payer: Self-pay

## 2019-08-19 DIAGNOSIS — O0990 Supervision of high risk pregnancy, unspecified, unspecified trimester: Secondary | ICD-10-CM

## 2019-08-19 DIAGNOSIS — F332 Major depressive disorder, recurrent severe without psychotic features: Secondary | ICD-10-CM

## 2019-08-19 NOTE — Progress Notes (Signed)
Patient seen and assessed by nursing staff.  Agree with documentation and plan.  

## 2019-08-19 NOTE — Progress Notes (Signed)
I connected with  [name] on 08/19/19 at  9:30 AM EDT by telephone and verified that I am speaking with the correct person using two identifiers.   I discussed the limitations, risks, security and privacy concerns of performing an evaluation and management service by telephone and the availability of in person appointments. I also discussed with the patient that there may be a patient responsible charge related to this service. The patient expressed understanding and agreed to proceed. I called Anderson Malta with [employer] and [name] denied need for interpreter. She states she speaks [place] and [place] and I could update her chart to show she does not need interpreter.  She states she can't talk long as she is on her way to work. She reports she is not pregnant anymore- she went to a [facility] clinic and had abortion and has gone back and had a negative test. She asked to cancel her appointments with [place].   [name],RN 08/19/2019  9:34 AM

## 2019-09-16 ENCOUNTER — Encounter: Payer: Self-pay | Admitting: Family Medicine

## 2019-10-13 ENCOUNTER — Encounter: Payer: Self-pay | Admitting: *Deleted

## 2019-10-13 ENCOUNTER — Ambulatory Visit: Payer: Self-pay | Attending: Family

## 2019-10-13 ENCOUNTER — Other Ambulatory Visit: Payer: Self-pay

## 2019-10-14 ENCOUNTER — Ambulatory Visit: Payer: HRSA Program | Attending: Internal Medicine

## 2019-10-14 DIAGNOSIS — Z20828 Contact with and (suspected) exposure to other viral communicable diseases: Secondary | ICD-10-CM | POA: Diagnosis present

## 2019-10-14 DIAGNOSIS — Z20822 Contact with and (suspected) exposure to covid-19: Secondary | ICD-10-CM

## 2019-10-15 LAB — NOVEL CORONAVIRUS, NAA: SARS-CoV-2, NAA: NOT DETECTED

## 2019-11-29 ENCOUNTER — Emergency Department (HOSPITAL_COMMUNITY): Payer: Self-pay

## 2019-11-29 ENCOUNTER — Emergency Department (HOSPITAL_COMMUNITY)
Admission: EM | Admit: 2019-11-29 | Discharge: 2019-11-29 | Disposition: A | Discharge status: Home / Self Care | Payer: Self-pay | Attending: Emergency Medicine | Admitting: Emergency Medicine

## 2019-11-29 ENCOUNTER — Other Ambulatory Visit: Payer: Self-pay

## 2019-11-29 ENCOUNTER — Encounter (HOSPITAL_COMMUNITY): Payer: Self-pay

## 2019-11-29 DIAGNOSIS — W010XXA Fall on same level from slipping, tripping and stumbling without subsequent striking against object, initial encounter: Secondary | ICD-10-CM | POA: Insufficient documentation

## 2019-11-29 DIAGNOSIS — Y929 Unspecified place or not applicable: Secondary | ICD-10-CM | POA: Insufficient documentation

## 2019-11-29 DIAGNOSIS — Y939 Activity, unspecified: Secondary | ICD-10-CM | POA: Insufficient documentation

## 2019-11-29 DIAGNOSIS — S82892A Other fracture of left lower leg, initial encounter for closed fracture: Secondary | ICD-10-CM | POA: Insufficient documentation

## 2019-11-29 DIAGNOSIS — Z79899 Other long term (current) drug therapy: Secondary | ICD-10-CM | POA: Insufficient documentation

## 2019-11-29 DIAGNOSIS — Y999 Unspecified external cause status: Secondary | ICD-10-CM | POA: Insufficient documentation

## 2019-11-29 DIAGNOSIS — Z87891 Personal history of nicotine dependence: Secondary | ICD-10-CM | POA: Insufficient documentation

## 2019-11-29 DIAGNOSIS — J45909 Unspecified asthma, uncomplicated: Secondary | ICD-10-CM | POA: Insufficient documentation

## 2019-11-29 NOTE — Discharge Instructions (Signed)
You were seen in the emergency department for evaluation of injuries from a fall.  You had x-rays of your left ankle which showed a posterior malleolar fracture of the tibia.  Orthopedics recommended a cam boot and for you to not be weightbearing.  Crutches were provided.  You will need to make an appointment with Dr. Magnus Ivan sometime this week.
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
## 2019-11-29 NOTE — ED Triage Notes (Signed)
Patient reports that she slipped on slippery ground early this AM. Patient c/o left ankle pain. Patient denies hitting her head or having LOC.

## 2019-11-29 NOTE — ED Notes (Signed)
Applied Cam Walker size medium and crutches size 5'2"-5'10"

## 2019-12-02 ENCOUNTER — Encounter: Payer: Self-pay | Admitting: Orthopaedic Surgery

## 2019-12-02 ENCOUNTER — Ambulatory Visit (INDEPENDENT_AMBULATORY_CARE_PROVIDER_SITE_OTHER): Payer: Self-pay | Admitting: Orthopaedic Surgery

## 2019-12-02 ENCOUNTER — Other Ambulatory Visit: Payer: Self-pay

## 2019-12-02 DIAGNOSIS — S93412A Sprain of calcaneofibular ligament of left ankle, initial encounter: Secondary | ICD-10-CM
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
## 2019-12-16 ENCOUNTER — Other Ambulatory Visit: Payer: Self-pay

## 2019-12-16 ENCOUNTER — Ambulatory Visit (INDEPENDENT_AMBULATORY_CARE_PROVIDER_SITE_OTHER): Payer: Self-pay

## 2019-12-16 ENCOUNTER — Encounter: Payer: Self-pay | Admitting: Orthopaedic Surgery

## 2019-12-16 ENCOUNTER — Ambulatory Visit (INDEPENDENT_AMBULATORY_CARE_PROVIDER_SITE_OTHER): Payer: Self-pay | Admitting: Orthopaedic Surgery

## 2019-12-16 DIAGNOSIS — S93412A Sprain of calcaneofibular ligament of left ankle, initial encounter: Secondary | ICD-10-CM

## 2019-12-16 DIAGNOSIS — M25572 Pain in left ankle and joints of left foot: Secondary | ICD-10-CM
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
## 2019-12-31 ENCOUNTER — Ambulatory Visit: Payer: Self-pay | Admitting: Orthopaedic Surgery

## 2020-01-20 ENCOUNTER — Emergency Department (HOSPITAL_COMMUNITY)
Admission: EM | Admit: 2020-01-20 | Discharge: 2020-01-20 | Disposition: A | Discharge status: Home / Self Care | Payer: Self-pay | Attending: Emergency Medicine | Admitting: Emergency Medicine

## 2020-01-20 ENCOUNTER — Encounter (HOSPITAL_COMMUNITY): Payer: Self-pay | Admitting: Emergency Medicine

## 2020-01-20 DIAGNOSIS — H01001 Unspecified blepharitis right upper eyelid: Secondary | ICD-10-CM

## 2020-01-20 DIAGNOSIS — F1290 Cannabis use, unspecified, uncomplicated: Secondary | ICD-10-CM | POA: Insufficient documentation

## 2020-01-20 DIAGNOSIS — Z79899 Other long term (current) drug therapy: Secondary | ICD-10-CM | POA: Insufficient documentation

## 2020-01-20 MED ORDER — ACETAMINOPHEN 500 MG PO TABS: 500.0000 mg | ORAL_TABLET | Freq: Four times a day (QID) | ORAL | 0 refills | Status: DC | PRN

## 2020-01-20 MED ORDER — HYPROMELLOSE (GONIOSCOPIC) 2.5 % OP SOLN: 1.0000 [drp] | Freq: Four times a day (QID) | OPHTHALMIC | 12 refills | Status: DC | PRN

## 2020-01-20 MED ORDER — IBUPROFEN 600 MG PO TABS: 600.0000 mg | ORAL_TABLET | Freq: Four times a day (QID) | ORAL | 0 refills | Status: DC | PRN

## 2020-01-20 MED ORDER — ERYTHROMYCIN 5 MG/GM OP OINT: TOPICAL_OINTMENT | OPHTHALMIC | 0 refills | Status: DC

## 2020-01-20 NOTE — Discharge Instructions (Signed)
Alternate 600 mg of ibuprofen and [phone] mg of Tylenol every 3-6 hours as needed for pain. Do not exceed 4000 mg of Tylenol daily.  Take ibuprofen with meals to avoid upset stomach issues.  Apply warm compresses 3 or 4 times daily to help with swelling and pain.  After you have applied a warm compress you can massage the eyelid with clean hands.   Apply the antibiotic ointment to the eyelid twice daily for the next 7 days.  You can also use the artificial tears as needed for dry eye.  Avoid wearing make-up or artificial lashes while this is healing up.  Do not wear contact lenses.  Wash your hands frequently and avoid touching your eyes.  Especially avoid touching the unaffected eye after touching the affected eye.  Call the ophthalmologist tomorrow morning to schedule follow-up appointment in the next 48 hours for reevaluation especially if your symptoms do not improve.  Return to the emergency department immediately any concerning signs or symptoms develop such as fevers, severe pain with eye movement or restriction with eye movement, vision loss, redness of the eyes.
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
## 2020-01-20 NOTE — ED Triage Notes (Signed)
Pt here with c/o right eye swollen , states that it was fine when she went to sleep last night , no blurred vision

## 2020-01-20 NOTE — ED Notes (Signed)
Patient verbalizes understanding of discharge instructions. Opportunity for questioning and answers were provided. Armband removed by staff, pt discharged from ED.  

## 2021-05-02 ENCOUNTER — Other Ambulatory Visit: Payer: Self-pay

## 2021-05-02 ENCOUNTER — Emergency Department (HOSPITAL_COMMUNITY)
Admission: EM | Admit: 2021-05-02 | Discharge: 2021-05-02 | Disposition: A | Discharge status: Home / Self Care | Payer: Self-pay | Attending: Emergency Medicine | Admitting: Emergency Medicine

## 2021-05-02 ENCOUNTER — Emergency Department (HOSPITAL_COMMUNITY): Payer: Self-pay

## 2021-05-02 ENCOUNTER — Encounter (HOSPITAL_COMMUNITY): Payer: Self-pay

## 2021-05-02 DIAGNOSIS — R112 Nausea with vomiting, unspecified: Secondary | ICD-10-CM | POA: Insufficient documentation

## 2021-05-02 DIAGNOSIS — R509 Fever, unspecified: Secondary | ICD-10-CM | POA: Insufficient documentation

## 2021-05-02 DIAGNOSIS — Z8616 Personal history of COVID-19: Secondary | ICD-10-CM | POA: Insufficient documentation

## 2021-05-02 DIAGNOSIS — R519 Headache, unspecified: Secondary | ICD-10-CM | POA: Insufficient documentation

## 2021-05-02 DIAGNOSIS — J45909 Unspecified asthma, uncomplicated: Secondary | ICD-10-CM | POA: Insufficient documentation

## 2021-05-02 LAB — CBC WITH DIFFERENTIAL/PLATELET
Abs Immature Granulocytes: 0.01 10*3/uL (ref 0.00–0.07)
Basophils Absolute: 0 10*3/uL (ref 0.0–0.1)
Basophils Relative: 1 %
Eosinophils Absolute: 0.1 10*3/uL (ref 0.0–0.5)
Eosinophils Relative: 1 %
HCT: 42.1 % (ref 36.0–46.0)
Hemoglobin: 13.9 g/dL (ref 12.0–15.0)
Immature Granulocytes: 0 %
Lymphocytes Relative: 38 %
Lymphs Abs: 2.6 10*3/uL (ref 0.7–4.0)
MCH: 30.4 pg (ref 26.0–34.0)
MCHC: 33 g/dL (ref 30.0–36.0)
MCV: 92.1 fL (ref 80.0–100.0)
Monocytes Absolute: 0.4 10*3/uL (ref 0.1–1.0)
Monocytes Relative: 6 %
Neutro Abs: 3.7 10*3/uL (ref 1.7–7.7)
Neutrophils Relative %: 54 %
Platelets: 326 10*3/uL (ref 150–400)
RBC: 4.57 MIL/uL (ref 3.87–5.11)
RDW: 12.3 % (ref 11.5–15.5)
WBC: 6.9 10*3/uL (ref 4.0–10.5)
nRBC: 0 % (ref 0.0–0.2)

## 2021-05-02 LAB — COMPREHENSIVE METABOLIC PANEL
ALT: 14 U/L (ref 0–44)
AST: 17 U/L (ref 15–41)
Albumin: 4.7 g/dL (ref 3.5–5.0)
Alkaline Phosphatase: 75 U/L (ref 38–126)
Anion gap: 9 (ref 5–15)
BUN: 11 mg/dL (ref 6–20)
CO2: 24 mmol/L (ref 22–32)
Calcium: 9.8 mg/dL (ref 8.9–10.3)
Chloride: 104 mmol/L (ref 98–111)
Creatinine, Ser: 0.66 mg/dL (ref 0.44–1.00)
GFR, Estimated: >60 mL/min (ref >60)
Glucose, Bld: 101 mg/dL — ABNORMAL HIGH (ref 70–99)
Potassium: 3.9 mmol/L (ref 3.5–5.1)
Sodium: 137 mmol/L (ref 135–145)
Total Bilirubin: 0.6 mg/dL (ref 0.3–1.2)
Total Protein: 7.8 g/dL (ref 6.5–8.1)

## 2021-05-02 MED ORDER — KETOROLAC TROMETHAMINE 30 MG/ML IJ SOLN
30.0000 mg | Freq: Once | INTRAMUSCULAR | Status: AC
  Administered 2021-05-02: 30 mg via INTRAVENOUS
  Filled 2021-05-02: qty 1

## 2021-05-02 MED ORDER — DEXAMETHASONE SODIUM PHOSPHATE 10 MG/ML IJ SOLN
10.0000 mg | Freq: Once | INTRAMUSCULAR | Status: AC
  Administered 2021-05-02: 10 mg via INTRAVENOUS
  Filled 2021-05-02: qty 1

## 2021-05-02 MED ORDER — SUMATRIPTAN SUCCINATE 100 MG PO TABS: 100.0000 mg | ORAL_TABLET | ORAL | 0 refills | Status: DC | PRN

## 2021-05-02 NOTE — ED Notes (Signed)
Pt verbalized understanding of d/c, medication, and follow up care. Ambulatory with steady gait.  
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
## 2021-05-02 NOTE — ED Provider Notes (Signed)
Emergency Medicine Provider Triage Evaluation Note  Tiffany Davidson , a 25 y.o. female  was evaluated in triage.  Pt complains of a headache that started about 3 to 4 days ago.   She states 3 to 4 days ago she began experiencing this frontal headache that has been waxing and waning.  Reports subjective fevers last night.  Reports associated blurry vision as well as dizziness when lying flat with her eyes closed.  Also reports intermittent nausea/vomiting with 2 episodes of vomiting this morning.  Nonbilious nonbloody.  No chest pain, shortness of breath, numbness, weakness, abdominal pain.  States that she had her last normal menstrual period last week and has not been sexually active since this occurred.  Reports a history of headaches in the past but never of this chronicity or severity.  Physical Exam  BP 122/89 (BP Location: Right Arm)   Pulse 70   Temp 98.6 F (37 C) (Oral)   Resp 16   Ht 5\' 6"  (1.676 m)   Wt 79.4 kg   SpO2 100%   BMI 28.25 kg/m  Gen:   Awake, no distress   Resp:  Normal effort  MSK:   Moves extremities without difficulty  Other:    Medical Decision Making  Medically screening exam initiated at 3:25 PM.  Appropriate orders placed.  Tiffany Davidson was informed that the remainder of the evaluation will be completed by another provider, this initial triage assessment does not replace that evaluation, and the importance of remaining in the ED until their evaluation is complete.   Tiffany Fulling, PA-C 05/02/21 1527    07/03/21, DO 05/02/21 1532

## 2021-05-02 NOTE — ED Triage Notes (Signed)
Patient c/o migraine x 3-4 days. Patient c/o sensitivity to light, N/V x 2 today, and lightheadedness.  Patient states she had a fever last night, but did not actually check it.

## 2021-08-01 IMAGING — CR DG ANKLE COMPLETE 3+V*L*
3 series · 3 of 3 positions shown · non-contrast
Comparison: None.

CLINICAL DATA: Status post trauma.

EXAM:
LEFT ANKLE COMPLETE - 3+ VIEW

[x ankle ap left]
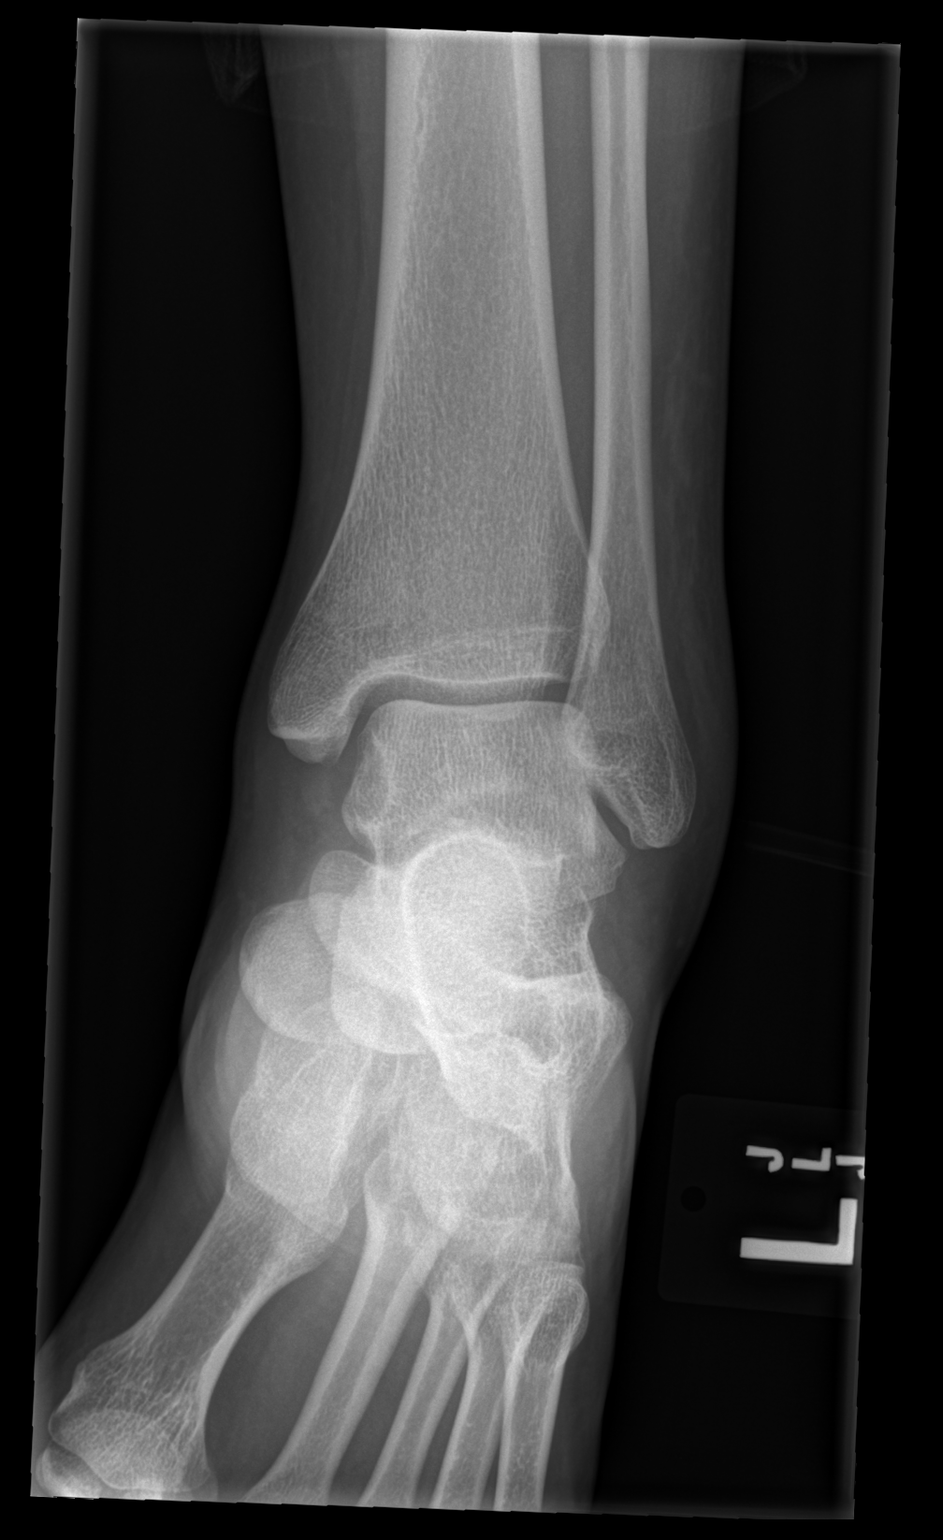

[x ankle obl left]
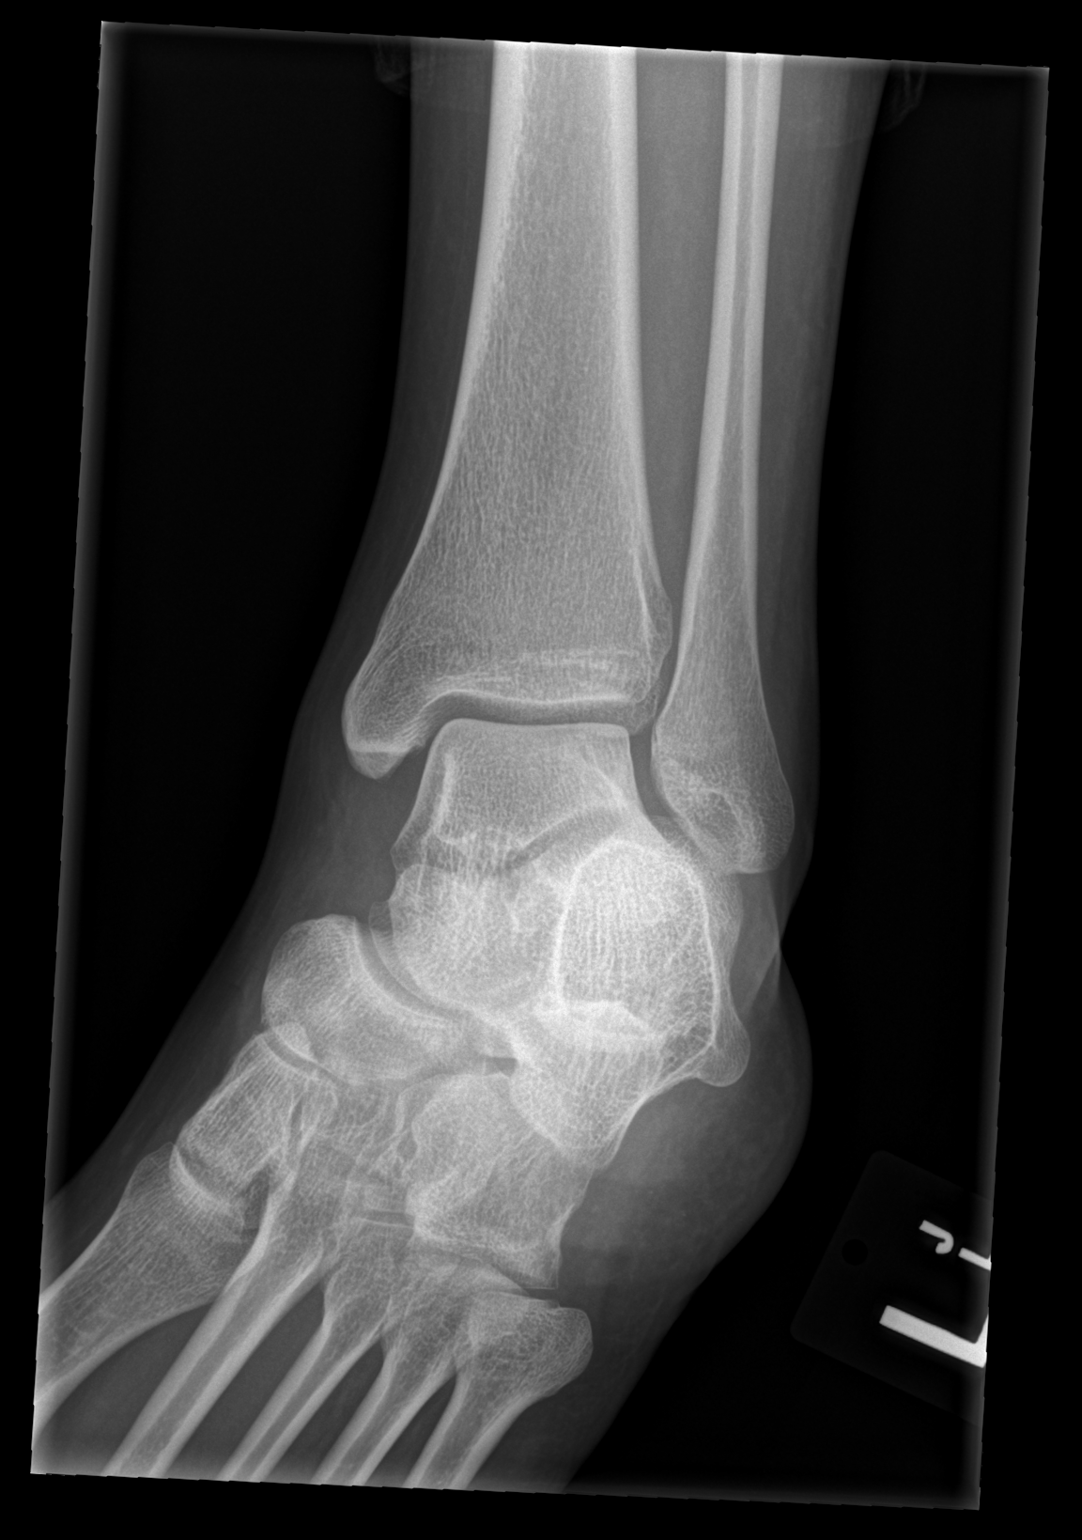

[x ankle lat left]
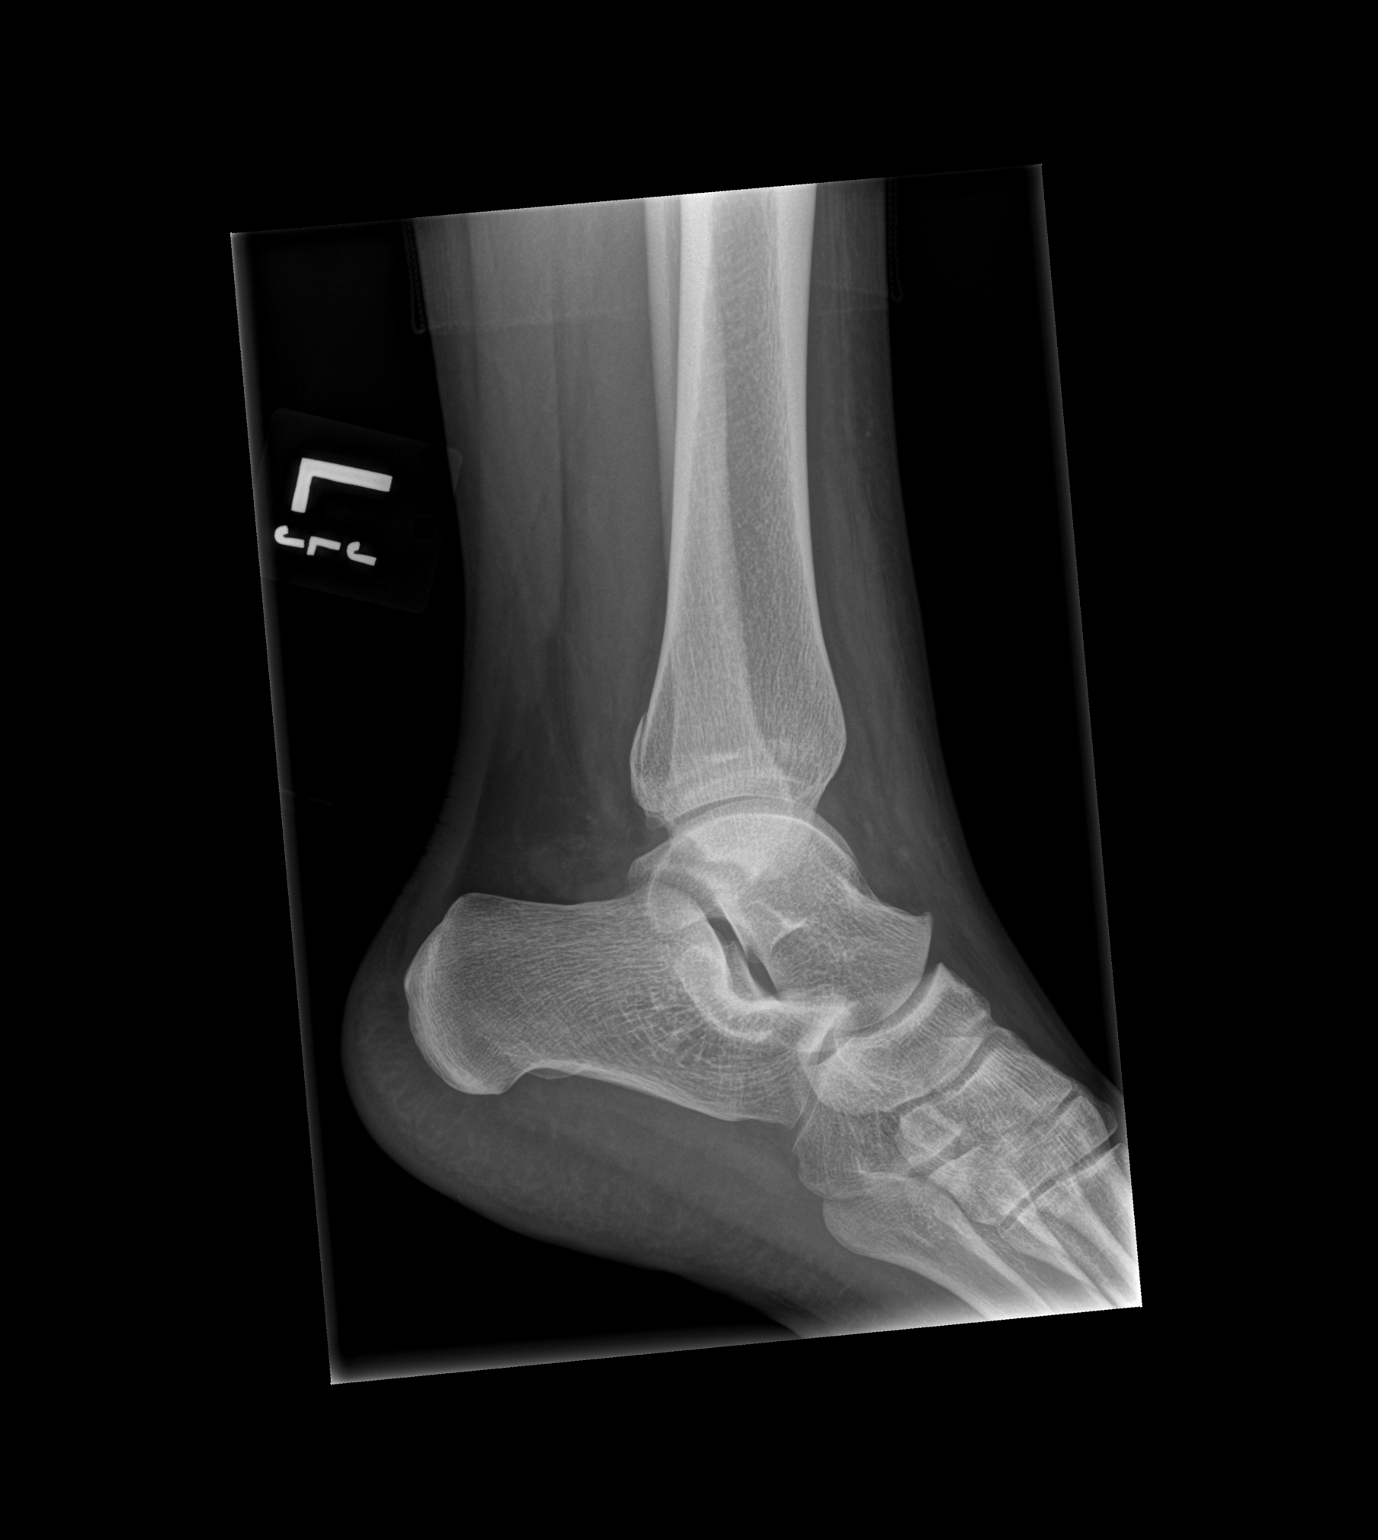

[3 of 3 positions shown; findings below may reference images not displayed]

FINDINGS: A small nondisplaced fracture deformity is seen involving the
posterior malleolus of the distal left tibia. There is no evidence
of associated dislocation. There is no evidence of arthropathy or
other focal bone abnormality. Mild diffuse soft tissue swelling is
seen.
IMPRESSION: Small nondisplaced fracture of the posterior malleolus of the distal
left tibia.

## 2022-07-13 ENCOUNTER — Emergency Department (HOSPITAL_COMMUNITY): Payer: 59

## 2022-07-13 ENCOUNTER — Other Ambulatory Visit: Payer: Self-pay

## 2022-07-13 ENCOUNTER — Encounter (HOSPITAL_COMMUNITY): Payer: Self-pay

## 2022-07-13 ENCOUNTER — Emergency Department (HOSPITAL_COMMUNITY)
Admission: EM | Admit: 2022-07-13 | Discharge: 2022-07-13 | Disposition: A | Discharge status: Home / Self Care | Payer: 59 | Attending: Emergency Medicine | Admitting: Emergency Medicine

## 2022-07-13 DIAGNOSIS — N9489 Other specified conditions associated with female genital organs and menstrual cycle: Secondary | ICD-10-CM | POA: Insufficient documentation

## 2022-07-13 DIAGNOSIS — R109 Unspecified abdominal pain: Secondary | ICD-10-CM | POA: Diagnosis present

## 2022-07-13 DIAGNOSIS — N200 Calculus of kidney: Secondary | ICD-10-CM | POA: Diagnosis not present

## 2022-07-13 LAB — CBC WITH DIFFERENTIAL/PLATELET
Abs Immature Granulocytes: 0.02 10*3/uL (ref 0.00–0.07)
Basophils Absolute: 0 10*3/uL (ref 0.0–0.1)
Basophils Relative: 0 %
Eosinophils Absolute: 0 10*3/uL (ref 0.0–0.5)
Eosinophils Relative: 1 %
HCT: 41.4 % (ref 36.0–46.0)
Hemoglobin: 13.5 g/dL (ref 12.0–15.0)
Immature Granulocytes: 0 %
Lymphocytes Relative: 38 %
Lymphs Abs: 2.4 10*3/uL (ref 0.7–4.0)
MCH: 30.8 pg (ref 26.0–34.0)
MCHC: 32.6 g/dL (ref 30.0–36.0)
MCV: 94.5 fL (ref 80.0–100.0)
Monocytes Absolute: 0.4 10*3/uL (ref 0.1–1.0)
Monocytes Relative: 6 %
Neutro Abs: 3.4 10*3/uL (ref 1.7–7.7)
Neutrophils Relative %: 55 %
Platelets: 313 10*3/uL (ref 150–400)
RBC: 4.38 MIL/uL (ref 3.87–5.11)
RDW: 12.7 % (ref 11.5–15.5)
WBC: 6.2 10*3/uL (ref 4.0–10.5)
nRBC: 0 % (ref 0.0–0.2)

## 2022-07-13 LAB — URINALYSIS, ROUTINE W REFLEX MICROSCOPIC
Bilirubin Urine: NEGATIVE
Glucose, UA: NEGATIVE mg/dL
Hgb urine dipstick: NEGATIVE
Ketones, ur: NEGATIVE mg/dL
Leukocytes,Ua: NEGATIVE
Nitrite: NEGATIVE
Protein, ur: NEGATIVE mg/dL
Specific Gravity, Urine: 1.017 (ref 1.005–1.030)
pH: 6 (ref 5.0–8.0)

## 2022-07-13 LAB — BASIC METABOLIC PANEL
Anion gap: 6 (ref 5–15)
BUN: 11 mg/dL (ref 6–20)
CO2: 24 mmol/L (ref 22–32)
Calcium: 9.3 mg/dL (ref 8.9–10.3)
Chloride: 109 mmol/L (ref 98–111)
Creatinine, Ser: 0.65 mg/dL (ref 0.44–1.00)
GFR, Estimated: >60 mL/min (ref >60)
Glucose, Bld: 104 mg/dL — ABNORMAL HIGH (ref 70–99)
Potassium: 4.2 mmol/L (ref 3.5–5.1)
Sodium: 139 mmol/L (ref 135–145)

## 2022-07-13 LAB — I-STAT BETA HCG BLOOD, ED (MC, WL, AP ONLY): I-stat hCG, quantitative: <5 m[IU]/mL (ref <5)

## 2022-07-13 MED ORDER — ONDANSETRON HCL 4 MG/2ML IJ SOLN
4.0000 mg | Freq: Once | INTRAMUSCULAR | Status: DC
  Filled 2022-07-13: qty 2

## 2022-07-13 MED ORDER — "STRAINER/STAINLESS STEEL/2.5"" MISC": 1.0000 [IU] | Freq: Every day | 0 refills | Status: DC

## 2022-07-13 MED ORDER — ACETAMINOPHEN 500 MG PO TABS
1000.0000 mg | ORAL_TABLET | Freq: Four times a day (QID) | ORAL | Status: DC | PRN
  Administered 2022-07-13: 1000 mg via ORAL
  Filled 2022-07-13: qty 2

## 2022-07-13 MED ORDER — ONDANSETRON HCL 4 MG PO TABS: 4.0000 mg | ORAL_TABLET | Freq: Three times a day (TID) | ORAL | 0 refills | Status: AC | PRN

## 2022-07-13 NOTE — ED Triage Notes (Signed)
Patient c/o intermittent left flank pain x 1 week and worse since yesterday. Patient states she has a known kidney stone.

## 2022-07-13 NOTE — ED Provider Notes (Signed)
[facility]-EMERGENCY DEPT Provider Note   CSN: [id number] Arrival date & time: 07/13/22  0753     History  Chief Complaint  Patient presents with   Flank Pain    Tiffany Davidson Orland Penman is a 26 y.o. female with PTSD, MDD, acanthosis, insulin resistance presents with flank pain.   Patient c/o intermittent left flank pain x 3 weeks and worse since yesterday.  Was originally evaluated by her primary care physician who performed a CT scan and told her she had a kidney stone on the left.  PCP told her to come back if her pain did not improve, and patient notes that her pain is not improving and is actually worsening now, the pain is coming more frequently and stronger.  Right now patient endorses 4-10 "soreness." Patient notes cramping flank pain and some left-sided abdominal pain with urination but no dysuria or hematuria, no pain in the urethra.  Denies any vaginal symptoms.  Endorses chronic constipation but nothing new.  Endorses nausea and vomiting intermittently.  Denies fever/chills, chest pain, shortness of breath, leg swelling.  Patient has not noted any stone that she has passed.   Flank Pain       Home Medications Prior to Admission medications   Medication Sig Start Date End Date Taking? Authorizing Provider  Misc. Devices (STRAINER/STAINLESS STEEL/2.5") MISC 1 Units by Does not apply route daily. 07/13/22  Yes Loetta Rough, MD  ondansetron (ZOFRAN) 4 MG tablet Take 1 tablet (4 mg total) by mouth every 8 (eight) hours as needed for up to 7 days for nausea or vomiting. 07/13/22 07/20/22 Yes Loetta Rough, MD  acetaminophen (TYLENOL) 500 MG tablet Take 1 tablet (500 mg total) by mouth every 6 (six) hours as needed. 01/20/20   Michela Pitcher A, PA-C  erythromycin ophthalmic ointment Place a 1/2 inch ribbon of ointment into the upper eyelid in the morning and at night x 7 days 01/20/20   Michela Pitcher A, PA-C  hydroxypropyl methylcellulose / hypromellose (ISOPTO  TEARS / GONIOVISC) 2.5 % ophthalmic solution Place 1 drop into the right eye 4 (four) times daily as needed for dry eyes. 01/20/20   Fawze, Mina A, PA-C  hydrOXYzine (ATARAX/VISTARIL) 25 MG tablet Take 1 tablet (25 mg total) by mouth 3 (three) times daily as needed for anxiety. 07/08/19   Aldean Baker, NP  ibuprofen (ADVIL) 600 MG tablet Take 1 tablet (600 mg total) by mouth every 6 (six) hours as needed. 01/20/20   Michela Pitcher A, PA-C  Prenatal Vit-Fe Fumarate-FA (PRENATAL MULTIVITAMIN) TABS tablet Take 1 tablet by mouth daily at 12 noon. 07/08/19   Aldean Baker, NP  sertraline (ZOLOFT) 50 MG tablet Take 1 tablet (50 mg total) by mouth daily. 07/09/19   Aldean Baker, NP  SUMAtriptan (IMITREX) 100 MG tablet Take 1 tablet (100 mg total) by mouth every 2 (two) hours as needed for migraine. May repeat in 2 hours if headache persists or recurs. 05/02/21   Koleen Distance, MD      Allergies    Patient has no known allergies.    Review of Systems   Review of Systems  Genitourinary:  Positive for flank pain.   Review of systems negative for fever/chills.  A 10 point review of systems was performed and is negative unless otherwise reported in HPI.  Physical Exam Updated Vital Signs BP 103/67 (BP Location: Right Arm)   Pulse 70   Temp 98.1 F (36.7 C) (Oral)  Resp 18   Ht 5\' 6"  (1.676 m)   Wt 74.8 kg   LMP 06/29/2022 (Approximate) Comment: negative HCG blood test 07-13-2022  SpO2 100%   BMI 26.63 kg/m  Physical Exam General: Normal appearing female, lying in bed.  HEENT: PERRLA, Sclera anicteric, MMM, trachea midline. Cardiology: RRR, no murmurs/rubs/gallops. Resp: Normal respiratory rate and effort.  Abd: LUQ/LLQ abdominal pain to palpation. Soft, non-distended. No rebound tenderness or guarding.  GU: Deferred. MSK: No peripheral edema or signs of trauma. Extremities without deformity or TTP. No cyanosis or clubbing. Skin: warm, dry. No rashes or lesions. Back: +L CVA tenderness Neuro:  A&Ox4, CNs II-XII grossly intact. MAEs. Sensation grossly intact.  Psych: Normal mood and affect.   ED Results / Procedures / Treatments   Labs (all labs ordered are listed, but only abnormal results are displayed) Labs Reviewed  URINALYSIS, ROUTINE W REFLEX MICROSCOPIC - Abnormal; Notable for the following components:      Result Value   APPearance HAZY (*)    All other components within normal limits  BASIC METABOLIC PANEL - Abnormal; Notable for the following components:   Glucose, Bld 104 (*)    All other components within normal limits  CBC WITH DIFFERENTIAL/PLATELET  I-STAT BETA HCG BLOOD, ED ([facility], [facility], [facility] ONLY)    EKG None  Radiology CT ABDOMEN PELVIS WO CONTRAST  Result Date: 07/13/2022 CLINICAL DATA:  Left flank pain EXAM: CT ABDOMEN AND PELVIS WITHOUT CONTRAST TECHNIQUE: Multidetector CT imaging of the abdomen and pelvis was performed following the standard protocol without IV contrast. RADIATION DOSE REDUCTION: This exam was performed according to the departmental dose-optimization program which includes automated exposure control, adjustment of the mA and/or kV according to patient size and/or use of iterative reconstruction technique. COMPARISON:  None Available. FINDINGS: Lower chest: No acute abnormality Hepatobiliary: No focal hepatic abnormality. Gallbladder unremarkable. Pancreas: No focal abnormality or ductal dilatation. Spleen: No focal abnormality.  Normal size. Adrenals/Urinary Tract: Punctate bilateral nephrolithiasis with 1 mm stones in the upper pole of the right kidney and midpole of the left kidney. No ureteral stones or hydronephrosis. Adrenal glands and urinary bladder unremarkable. Stomach/Bowel: Normal appendix. Stomach, large and small bowel grossly unremarkable. Vascular/Lymphatic: No evidence of aneurysm or adenopathy. Reproductive: Uterus and adnexa unremarkable.  No mass. Other: Small amount of free fluid in the cul-de-sac, likely physiologic. No free air.  Musculoskeletal: No acute bony abnormality. IMPRESSION: Punctate bilateral nephrolithiasis. No ureteral stones or hydronephrosis. No acute findings. Electronically Signed   By: Rolm Baptise M.D.   On: 07/13/2022 10:50    Procedures Procedures    Medications Ordered in ED Medications  ondansetron [facility]) injection 4 mg (0 mg Intravenous Hold 07/13/22 0947)  acetaminophen (TYLENOL) tablet 1,000 mg (1,000 mg Oral Given 07/13/22 1224)    ED Course/ Medical Decision Making/ A&P                          Medical Decision Making Amount and/or Complexity of Data Reviewed Labs: ordered. Decision-making details documented in ED Course. Radiology: ordered. Decision-making details documented in ED Course.  Risk OTC drugs. Prescription drug management.    Patient is sitting up, HDS, overall well-appearing.  Patient had a prior CT scan that demonstrated a left renal stone.  The scan was approximately 1 week ago but patient's pain started 3 weeks ago, and it is only worsening now.  Consider septic nephrolithiasis with possible UTI, inability to pass stone given its size, worsening  nephrolithiasis or additional stones present.  Patient's image not available in the system or in care everywhere, unable to see the image and patient does not know how large the stone was.  Will obtain labs to evaluate for UTI, electrolyte abnormalities or renal injury.  We will also obtain another CT scan given that the prior CT scan is unavailable and patient's pain is worsening.   I have personally reviewed and interpreted all labs and imaging.   Clinical Course as of 07/13/22 1306  Fri Jul 13, 2022  1051 I-stat hCG, quantitative: <5.0 [HN]  1051 Creatinine: 0.65 [HN]  1051 BUN: 11 [HN]  1051 WBC: 6.2 [HN]  1130 CT ABDOMEN PELVIS WO CONTRAST Punctate bilateral nephrolithiasis. No ureteral stones or hydronephrosis. No acute findings. [HN]  1235 Urinalysis, Routine w reflex microscopic Urine, Clean Catch(!) No  e/o UTI [HN]    Clinical Course User Index [HN] Loetta Rough, MD   Patient's labs are reassuring with no renal injury or electrolyte abnormalities.  No leukocytosis.  UA with no evidence of UTI.  CT Abdo pelvis demonstrates punctate bilateral nephrolithiasis but no ureterolithiasis and no hydronephrosis.  Patient reevaluated and she feels slightly improved, still overall hemodynamic stable and well-appearing..  Patient is advised to strain her urine and to take Zofran as needed for nausea vomiting.  Advised to follow-up with her primary care physician within 1 week for reevaluation.  Patient told to alternate taking Tylenol ibuprofen for pain given discharge directions return precaution related to renal colic, including to return to the ED if she has any fever/chills, worsening pain, or pain does not improve in 1 to 2 weeks.  Patient reports understanding, all questions answered to patient's faction.  Dispo: DC         Final Clinical Impression(s) / ED Diagnoses Final diagnoses:  Flank pain  Nephrolithiasis    Rx / DC Orders ED Discharge Orders          Ordered    Misc. Devices (STRAINER/STAINLESS STEEL/2.5") MISC  Daily        07/13/22 1244    ondansetron (ZOFRAN) 4 MG tablet  Every 8 hours PRN        07/13/22 1244             This note was created using dictation software, which may contain spelling or grammatical errors.    Loetta Rough, MD 07/13/22 [phone]

## 2022-11-21 ENCOUNTER — Other Ambulatory Visit: Payer: Self-pay

## 2022-11-21 ENCOUNTER — Emergency Department (HOSPITAL_COMMUNITY)
Admission: EM | Admit: 2022-11-21 | Discharge: 2022-11-21 | Disposition: A | Discharge status: Home / Self Care | Payer: Medicaid Other | Attending: Emergency Medicine | Admitting: Emergency Medicine

## 2022-11-21 ENCOUNTER — Encounter (HOSPITAL_COMMUNITY): Payer: Self-pay

## 2022-11-21 DIAGNOSIS — J069 Acute upper respiratory infection, unspecified: Secondary | ICD-10-CM | POA: Insufficient documentation

## 2022-11-21 DIAGNOSIS — Z1152 Encounter for screening for COVID-19: Secondary | ICD-10-CM | POA: Diagnosis not present

## 2022-11-21 DIAGNOSIS — H6690 Otitis media, unspecified, unspecified ear: Secondary | ICD-10-CM | POA: Insufficient documentation

## 2022-11-21 DIAGNOSIS — O99511 Diseases of the respiratory system complicating pregnancy, first trimester: Secondary | ICD-10-CM | POA: Diagnosis not present

## 2022-11-21 DIAGNOSIS — Z3490 Encounter for supervision of normal pregnancy, unspecified, unspecified trimester: Secondary | ICD-10-CM

## 2022-11-21 DIAGNOSIS — O99891 Other specified diseases and conditions complicating pregnancy: Secondary | ICD-10-CM | POA: Diagnosis present

## 2022-11-21 DIAGNOSIS — J029 Acute pharyngitis, unspecified: Secondary | ICD-10-CM | POA: Diagnosis not present

## 2022-11-21 LAB — RESP PANEL BY RT-PCR (RSV, FLU A&B, COVID)  RVPGX2
Influenza A by PCR: NEGATIVE
Influenza B by PCR: NEGATIVE
Resp Syncytial Virus by PCR: NEGATIVE
SARS Coronavirus 2 by RT PCR: NEGATIVE

## 2022-11-21 LAB — PREGNANCY, URINE: Preg Test, Ur: POSITIVE — AB

## 2022-11-21 LAB — GROUP A STREP BY PCR: Group A Strep by PCR: NOT DETECTED

## 2022-11-21 MED ORDER — AMOXICILLIN-POT CLAVULANATE 875-125 MG PO TABS: 1.0000 | ORAL_TABLET | Freq: Two times a day (BID) | ORAL | 0 refills | Status: AC

## 2022-11-21 NOTE — ED Triage Notes (Signed)
Patient said she has been sick for a week. Sore throat, coughing up flim. [redacted] weeks pregnant.

## 2022-11-21 NOTE — Discharge Instructions (Addendum)
It was a pleasure taking care of you today!   Your swab was negative for COVID, Flu, RSV, strep today. You may continue with over the counter lozenges for your symptoms. You may take tylenol as needed for pain. You will be sent a prescription for Augmentin to treat for an ear infection, take as directed.   At home, ensure that you are taking over the counter prenatal vitamins. Attached is information for OB-GYN groups in the area, call today and set up a follow up appointment regarding todays ED visit.   Return to the ED if you are experiencing increasing/worsening symptoms.

## 2022-11-21 NOTE — ED Provider Notes (Signed)
[facility] Provider Note   CSN: [id number] Arrival date & time: 11/21/22  1316     History  Chief Complaint  Patient presents with   Sore Throat    Tiffany Davidson is a 27 y.o. female who presents to the ED with concerns for sore throat x 1 week. Tried OTC meds for her symptoms. Has associated nasal congestion, rhinorrhea, cough, painful swallowing. Denies shortness of breath, trouble swallowing. Her LMP was November. No OB care at this time. She is having a hard time getting in to see a specialist. Denies vaginal bleeding, rush of fluids, pelvic/abdominal pain.    The history is provided by the patient. No language interpreter was used.       Home Medications Prior to Admission medications   Medication Sig Start Date End Date Taking? Authorizing Provider  amoxicillin-clavulanate (AUGMENTIN) 875-125 MG tablet Take 1 tablet by mouth every 12 (twelve) hours for 5 days. 11/21/22 11/26/22 Yes Madaline Lefeber A, PA-C  acetaminophen (TYLENOL) 500 MG tablet Take 1 tablet (500 mg total) by mouth every 6 (six) hours as needed. 01/20/20   Rodell Perna A, PA-C  erythromycin ophthalmic ointment Place a 1/2 inch ribbon of ointment into the upper eyelid in the morning and at night x 7 days 01/20/20   Rodell Perna A, PA-C  hydroxypropyl methylcellulose / hypromellose (ISOPTO TEARS / GONIOVISC) 2.5 % ophthalmic solution Place 1 drop into the right eye 4 (four) times daily as needed for dry eyes. 01/20/20   Fawze, Mina A, PA-C  hydrOXYzine (ATARAX/VISTARIL) 25 MG tablet Take 1 tablet (25 mg total) by mouth 3 (three) times daily as needed for anxiety. 07/08/19   Connye Burkitt, NP  ibuprofen (ADVIL) 600 MG tablet Take 1 tablet (600 mg total) by mouth every 6 (six) hours as needed. 01/20/20   Rodell Perna A, PA-C  Misc. Devices (STRAINER/STAINLESS STEEL/2.5") MISC 1 Units by Does not apply route daily. 07/13/22   Audley Hose, MD  Prenatal Vit-Fe Fumarate-FA  (PRENATAL MULTIVITAMIN) TABS tablet Take 1 tablet by mouth daily at 12 noon. 07/08/19   Connye Burkitt, NP  sertraline (ZOLOFT) 50 MG tablet Take 1 tablet (50 mg total) by mouth daily. 07/09/19   Connye Burkitt, NP  SUMAtriptan (IMITREX) 100 MG tablet Take 1 tablet (100 mg total) by mouth every 2 (two) hours as needed for migraine. May repeat in 2 hours if headache persists or recurs. 05/02/21   Arnaldo Natal, MD      Allergies    Patient has no known allergies.    Review of Systems   Review of Systems  All other systems reviewed and are negative.   Physical Exam Updated Vital Signs BP 123/75 (BP Location: Right Arm)   Pulse 91   Temp 98.3 F (36.8 C) (Oral)   Resp 19   Ht 5\' 6"  (1.676 m)   Wt 79.4 kg   SpO2 98%   BMI 28.25 kg/m  Physical Exam Vitals and nursing note reviewed.  Constitutional:      General: She is not in acute distress.    Appearance: Normal appearance.  HENT:     Mouth/Throat:     Mouth: Mucous membranes are moist.     Pharynx: Oropharynx is clear. Uvula midline. No posterior oropharyngeal erythema or uvula swelling.     Tonsils: No tonsillar exudate.     Comments: Uvula midline without swelling. No posterior pharyngeal erythema or tonsillar exudate noted.  Patent airway. Pt able to speak in clear complete sentences. Tolerating oral secretions. Eyes:     General: No scleral icterus.    Extraocular Movements: Extraocular movements intact.  Cardiovascular:     Rate and Rhythm: Normal rate.  Pulmonary:     Effort: Pulmonary effort is normal. No respiratory distress.  Abdominal:     Palpations: Abdomen is soft. There is no mass.     Tenderness: There is no abdominal tenderness.  Musculoskeletal:        General: Normal range of motion.     Cervical back: Neck supple.  Skin:    General: Skin is warm and dry.     Findings: No rash.  Neurological:     Mental Status: She is alert.     Sensory: Sensation is intact.     Motor: Motor function is intact.   Psychiatric:        Behavior: Behavior normal.     ED Results / Procedures / Treatments   Labs (all labs ordered are listed, but only abnormal results are displayed) Labs Reviewed  PREGNANCY, URINE - Abnormal; Notable for the following components:      Result Value   Preg Test, Ur POSITIVE (*)    All other components within normal limits  GROUP A STREP BY PCR  RESP PANEL BY RT-PCR (RSV, FLU A&B, COVID)  [id number]    EKG None  Radiology No results found.  Procedures Procedures    Medications Ordered in ED Medications - No data to display  ED Course/ Medical Decision Making/ A&P Clinical Course as of 11/21/22 1613  Wed Nov 21, 2022  1454 Preg Test, Ur(!): POSITIVE [SB]  1508 Discussed with patient discharge treatment plan. Answered all available questions. Pt appears safe for discharge at this time.  [SB]  1522 Confirmed with pharmacist that augmentin safe in pregnancy [SB]    Clinical Course User Index [SB] Alorah Mcree A, PA-C                             Medical Decision Making Amount and/or Complexity of Data Reviewed Labs: ordered. Decision-making details documented in ED Course.  Risk Prescription drug management.   Pt presents with concerns for sore throat onset 1 week. Tried OTC meds. Currently pregnant, no OB care at this time.. Vital signs, pt afebrile. On exam, pt with uvula midline without swelling. Able to speak in clear complete sentences. No acute cardiovascular, respiratory, abdominal exam findings. Differential diagnosis includes COVID, flu, viral URI with cough, PNA.    Labs:  I ordered, and personally interpreted labs.  The pertinent results include:   Negative COVID, Flu, RSV, strep swabs Positive pregnancy urine   Disposition: Presentation suspicious for Viral URI with cough. Also notable for otitis media. Doubt at this time concerns for COVID, RSV, Flu, Strep. Pt provided with information for OBGYN for follow up for prenatal care.  Instructed to start taking over the counter prenatal vitamins. After consideration of the diagnostic results and the patients response to treatment, I feel that the patient would benefit from Discharge home. Supportive care measures and strict return precautions discussed with patient at bedside. Pt acknowledges and verbalizes understanding. Pt appears safe for discharge. Follow up as indicated in discharge paperwork.    This chart was dictated using voice recognition software, Dragon. Despite the best efforts of this provider to proofread and correct errors, errors may still occur which can change documentation meaning.  Final Clinical Impression(s) / ED Diagnoses Final diagnoses:  Pregnancy, unspecified gestational age  Viral URI  Viral pharyngitis  Acute otitis media, unspecified otitis media type    Rx / DC Orders ED Discharge Orders          Ordered    amoxicillin-clavulanate (AUGMENTIN) 875-125 MG tablet  Every 12 hours        11/21/22 1522              Carling Liberman A, PA-C 11/21/22 1615    Audley Hose, MD 11/21/22 2259

## 2022-12-11 ENCOUNTER — Encounter (HOSPITAL_COMMUNITY): Payer: Self-pay

## 2022-12-11 ENCOUNTER — Other Ambulatory Visit: Payer: Self-pay

## 2022-12-11 ENCOUNTER — Inpatient Hospital Stay (HOSPITAL_COMMUNITY)
Admission: AD | Admit: 2022-12-11 | Discharge: 2022-12-11 | Disposition: A | Discharge status: Home / Self Care | Payer: Medicaid Other | Attending: Obstetrics and Gynecology | Admitting: Obstetrics and Gynecology

## 2022-12-11 DIAGNOSIS — R10815 Periumbilic abdominal tenderness: Secondary | ICD-10-CM | POA: Diagnosis not present

## 2022-12-11 DIAGNOSIS — W19XXXA Unspecified fall, initial encounter: Secondary | ICD-10-CM

## 2022-12-11 DIAGNOSIS — O99511 Diseases of the respiratory system complicating pregnancy, first trimester: Secondary | ICD-10-CM | POA: Diagnosis not present

## 2022-12-11 DIAGNOSIS — W000XXA Fall on same level due to ice and snow, initial encounter: Secondary | ICD-10-CM | POA: Diagnosis not present

## 2022-12-11 DIAGNOSIS — O99611 Diseases of the digestive system complicating pregnancy, first trimester: Secondary | ICD-10-CM | POA: Insufficient documentation

## 2022-12-11 DIAGNOSIS — O26891 Other specified pregnancy related conditions, first trimester: Secondary | ICD-10-CM | POA: Insufficient documentation

## 2022-12-11 DIAGNOSIS — Z3491 Encounter for supervision of normal pregnancy, unspecified, first trimester: Secondary | ICD-10-CM

## 2022-12-11 DIAGNOSIS — O99341 Other mental disorders complicating pregnancy, first trimester: Secondary | ICD-10-CM | POA: Diagnosis not present

## 2022-12-11 DIAGNOSIS — Z3A12 12 weeks gestation of pregnancy: Secondary | ICD-10-CM | POA: Diagnosis not present

## 2022-12-11 DIAGNOSIS — R109 Unspecified abdominal pain: Secondary | ICD-10-CM | POA: Diagnosis not present

## 2022-12-11 DIAGNOSIS — K5900 Constipation, unspecified: Secondary | ICD-10-CM | POA: Diagnosis not present

## 2022-12-11 MED ORDER — ACETAMINOPHEN 325 MG PO TABS: 650.0000 mg | ORAL_TABLET | ORAL | 0 refills | Status: AC | PRN

## 2022-12-11 NOTE — Discharge Instructions (Signed)

## 2022-12-11 NOTE — MAU Note (Signed)
Tiffany Davidson is a 27 y.o. at 61w4dhere in MAU reporting: this morning while at work she slipped and fell onto her bottom. States she feels fine but was encouraged to come in for evaluation by employer. Intermittent abdominal pain since the fall. No bleeding.   LMP: 09/14/22  Onset of complaint: today  Pain score: 4/10  Vitals:   12/11/22 0857  BP: 116/67  Pulse: 80  Resp: 16  Temp: 98.9 F (37.2 C)  SpO2: 98%     FHT:152  Lab orders placed from triage: none
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
## 2022-12-13 DIAGNOSIS — Z3A12 12 weeks gestation of pregnancy: Secondary | ICD-10-CM | POA: Diagnosis not present

## 2022-12-13 DIAGNOSIS — R350 Frequency of micturition: Secondary | ICD-10-CM | POA: Diagnosis not present

## 2022-12-13 DIAGNOSIS — O26891 Other specified pregnancy related conditions, first trimester: Secondary | ICD-10-CM | POA: Diagnosis not present

## 2022-12-13 DIAGNOSIS — R110 Nausea: Secondary | ICD-10-CM | POA: Diagnosis not present

## 2022-12-13 DIAGNOSIS — R109 Unspecified abdominal pain: Secondary | ICD-10-CM | POA: Diagnosis not present

## 2022-12-13 DIAGNOSIS — O26811 Pregnancy related exhaustion and fatigue, first trimester: Secondary | ICD-10-CM | POA: Diagnosis not present

## 2022-12-21 DIAGNOSIS — J4520 Mild intermittent asthma, uncomplicated: Secondary | ICD-10-CM | POA: Diagnosis not present

## 2022-12-21 DIAGNOSIS — Z2839 Other underimmunization status: Secondary | ICD-10-CM | POA: Diagnosis not present

## 2022-12-21 DIAGNOSIS — Z833 Family history of diabetes mellitus: Secondary | ICD-10-CM | POA: Diagnosis not present

## 2022-12-21 DIAGNOSIS — Z3A14 14 weeks gestation of pregnancy: Secondary | ICD-10-CM | POA: Diagnosis not present

## 2022-12-21 DIAGNOSIS — O99512 Diseases of the respiratory system complicating pregnancy, second trimester: Secondary | ICD-10-CM | POA: Diagnosis not present

## 2022-12-21 DIAGNOSIS — Z87442 Personal history of urinary calculi: Secondary | ICD-10-CM | POA: Diagnosis not present

## 2022-12-21 DIAGNOSIS — Z8759 Personal history of other complications of pregnancy, childbirth and the puerperium: Secondary | ICD-10-CM | POA: Diagnosis not present

## 2022-12-21 DIAGNOSIS — O99212 Obesity complicating pregnancy, second trimester: Secondary | ICD-10-CM | POA: Diagnosis not present

## 2022-12-21 DIAGNOSIS — O09892 Supervision of other high risk pregnancies, second trimester: Secondary | ICD-10-CM | POA: Diagnosis not present

## 2022-12-21 DIAGNOSIS — Z419 Encounter for procedure for purposes other than remedying health state, unspecified: Secondary | ICD-10-CM | POA: Diagnosis not present

## 2022-12-24 DIAGNOSIS — Z3201 Encounter for pregnancy test, result positive: Secondary | ICD-10-CM | POA: Diagnosis not present

## 2022-12-28 DIAGNOSIS — Z3493 Encounter for supervision of normal pregnancy, unspecified, third trimester: Secondary | ICD-10-CM | POA: Diagnosis not present

## 2022-12-28 DIAGNOSIS — R7309 Other abnormal glucose: Secondary | ICD-10-CM | POA: Diagnosis not present

## 2023-01-18 DIAGNOSIS — O26899 Other specified pregnancy related conditions, unspecified trimester: Secondary | ICD-10-CM | POA: Diagnosis not present

## 2023-01-18 DIAGNOSIS — J45909 Unspecified asthma, uncomplicated: Secondary | ICD-10-CM | POA: Diagnosis not present

## 2023-01-18 DIAGNOSIS — O99512 Diseases of the respiratory system complicating pregnancy, second trimester: Secondary | ICD-10-CM | POA: Diagnosis not present

## 2023-01-18 DIAGNOSIS — O99519 Diseases of the respiratory system complicating pregnancy, unspecified trimester: Secondary | ICD-10-CM | POA: Diagnosis not present

## 2023-01-18 DIAGNOSIS — O26892 Other specified pregnancy related conditions, second trimester: Secondary | ICD-10-CM | POA: Diagnosis not present

## 2023-01-18 DIAGNOSIS — R519 Headache, unspecified: Secondary | ICD-10-CM | POA: Diagnosis not present

## 2023-01-18 DIAGNOSIS — Z3A18 18 weeks gestation of pregnancy: Secondary | ICD-10-CM | POA: Diagnosis not present

## 2023-01-21 ENCOUNTER — Inpatient Hospital Stay (HOSPITAL_COMMUNITY)
Admission: AD | Admit: 2023-01-21 | Discharge: 2023-01-21 | Disposition: A | Discharge status: Home / Self Care | Payer: Medicaid Other | Attending: Family Medicine | Admitting: Family Medicine

## 2023-01-21 ENCOUNTER — Encounter (HOSPITAL_COMMUNITY): Payer: Self-pay | Admitting: Family Medicine

## 2023-01-21 ENCOUNTER — Inpatient Hospital Stay (HOSPITAL_BASED_OUTPATIENT_CLINIC_OR_DEPARTMENT_OTHER): Payer: Medicaid Other

## 2023-01-21 DIAGNOSIS — Z3A18 18 weeks gestation of pregnancy: Secondary | ICD-10-CM | POA: Insufficient documentation

## 2023-01-21 DIAGNOSIS — R109 Unspecified abdominal pain: Secondary | ICD-10-CM | POA: Insufficient documentation

## 2023-01-21 DIAGNOSIS — O99891 Other specified diseases and conditions complicating pregnancy: Secondary | ICD-10-CM | POA: Diagnosis not present

## 2023-01-21 DIAGNOSIS — Z419 Encounter for procedure for purposes other than remedying health state, unspecified: Secondary | ICD-10-CM | POA: Diagnosis not present

## 2023-01-21 DIAGNOSIS — R102 Pelvic and perineal pain: Secondary | ICD-10-CM

## 2023-01-21 DIAGNOSIS — O26892 Other specified pregnancy related conditions, second trimester: Secondary | ICD-10-CM | POA: Diagnosis not present

## 2023-01-21 LAB — URINALYSIS, ROUTINE W REFLEX MICROSCOPIC
Bilirubin Urine: NEGATIVE
Glucose, UA: NEGATIVE mg/dL
Hgb urine dipstick: NEGATIVE
Ketones, ur: NEGATIVE mg/dL
Leukocytes,Ua: NEGATIVE
Nitrite: NEGATIVE
Protein, ur: NEGATIVE mg/dL
Specific Gravity, Urine: 1.019 (ref 1.005–1.030)
pH: 5 (ref 5.0–8.0)

## 2023-01-21 LAB — WET PREP, GENITAL
Clue Cells Wet Prep HPF POC: NONE SEEN
Trich, Wet Prep: NONE SEEN
WBC, Wet Prep HPF POC: <10 (ref <10)
Yeast Wet Prep HPF POC: NONE SEEN

## 2023-01-21 MED ORDER — TRAMADOL HCL 50 MG PO TABS: 50.0000 mg | ORAL_TABLET | Freq: Four times a day (QID) | ORAL | 0 refills | Status: AC | PRN

## 2023-01-21 MED ORDER — TRAMADOL HCL 50 MG PO TABS
100.0000 mg | ORAL_TABLET | Freq: Once | ORAL | Status: AC
  Administered 2023-01-21: 100 mg via ORAL
  Filled 2023-01-21: qty 2

## 2023-01-21 MED ORDER — IBUPROFEN 600 MG PO TABS
600.0000 mg | ORAL_TABLET | Freq: Once | ORAL | Status: AC
  Administered 2023-01-21: 600 mg via ORAL
  Filled 2023-01-21: qty 1

## 2023-01-21 MED ORDER — IBUPROFEN 600 MG PO TABS: 600.0000 mg | ORAL_TABLET | Freq: Four times a day (QID) | ORAL | 1 refills | Status: AC

## 2023-01-21 MED ORDER — POLYETHYLENE GLYCOL 3350 17 GM/SCOOP PO POWD: 17.0000 g | Freq: Every day | ORAL | 2 refills | Status: AC | PRN

## 2023-01-21 NOTE — MAU Note (Signed)
.  Tiffany Davidson is a 27 y.o. at [redacted]w[redacted]d here in MAU reporting: increased abd pain and vagina pressure that started tonight . Woke her up out of her sleep. Denies any vag bleeding or leaking.  Has had some N/V as well with the pain LMP:  Onset of complaint: 1-2 hours ago  Pain score: 7 Vitals:   01/21/23 0554  BP: 117/66  Pulse: 81  Resp: 18  Temp: 97.8 F (36.6 C)     FHT:145 Lab orders placed from triage:  u/a
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
## 2023-01-22 LAB — GC/CHLAMYDIA PROBE AMP (~~LOC~~) NOT AT ARMC
Chlamydia: NEGATIVE
Comment: NEGATIVE
Comment: NORMAL
Neisseria Gonorrhea: NEGATIVE

## 2023-01-23 DIAGNOSIS — Z3A18 18 weeks gestation of pregnancy: Secondary | ICD-10-CM | POA: Diagnosis not present

## 2023-01-23 DIAGNOSIS — R1030 Lower abdominal pain, unspecified: Secondary | ICD-10-CM | POA: Diagnosis not present

## 2023-01-23 DIAGNOSIS — Z6740 Type O blood, Rh positive: Secondary | ICD-10-CM | POA: Diagnosis not present

## 2023-01-23 DIAGNOSIS — O26892 Other specified pregnancy related conditions, second trimester: Secondary | ICD-10-CM | POA: Diagnosis not present

## 2023-01-23 DIAGNOSIS — O26852 Spotting complicating pregnancy, second trimester: Secondary | ICD-10-CM | POA: Diagnosis not present

## 2023-01-23 DIAGNOSIS — O4692 Antepartum hemorrhage, unspecified, second trimester: Secondary | ICD-10-CM | POA: Diagnosis not present

## 2023-01-23 DIAGNOSIS — Z87442 Personal history of urinary calculi: Secondary | ICD-10-CM | POA: Diagnosis not present

## 2023-01-23 DIAGNOSIS — O99810 Abnormal glucose complicating pregnancy: Secondary | ICD-10-CM | POA: Diagnosis not present

## 2023-01-23 DIAGNOSIS — R102 Pelvic and perineal pain: Secondary | ICD-10-CM | POA: Diagnosis not present

## 2023-01-29 DIAGNOSIS — Z3A19 19 weeks gestation of pregnancy: Secondary | ICD-10-CM | POA: Diagnosis not present

## 2023-01-29 DIAGNOSIS — K5900 Constipation, unspecified: Secondary | ICD-10-CM | POA: Diagnosis not present

## 2023-01-29 DIAGNOSIS — O99612 Diseases of the digestive system complicating pregnancy, second trimester: Secondary | ICD-10-CM | POA: Diagnosis not present

## 2023-02-05 DIAGNOSIS — F419 Anxiety disorder, unspecified: Secondary | ICD-10-CM | POA: Diagnosis not present

## 2023-02-05 DIAGNOSIS — F4310 Post-traumatic stress disorder, unspecified: Secondary | ICD-10-CM | POA: Diagnosis not present

## 2023-02-05 DIAGNOSIS — O99519 Diseases of the respiratory system complicating pregnancy, unspecified trimester: Secondary | ICD-10-CM | POA: Diagnosis not present

## 2023-02-05 DIAGNOSIS — J45909 Unspecified asthma, uncomplicated: Secondary | ICD-10-CM | POA: Diagnosis not present

## 2023-02-05 DIAGNOSIS — O09891 Supervision of other high risk pregnancies, first trimester: Secondary | ICD-10-CM | POA: Diagnosis not present

## 2023-02-05 DIAGNOSIS — Z363 Encounter for antenatal screening for malformations: Secondary | ICD-10-CM | POA: Diagnosis not present

## 2023-02-05 DIAGNOSIS — F329 Major depressive disorder, single episode, unspecified: Secondary | ICD-10-CM | POA: Diagnosis not present

## 2023-02-05 DIAGNOSIS — Z3A20 20 weeks gestation of pregnancy: Secondary | ICD-10-CM | POA: Diagnosis not present

## 2024-03-03 ENCOUNTER — Emergency Department (HOSPITAL_BASED_OUTPATIENT_CLINIC_OR_DEPARTMENT_OTHER)
Admission: EM | Admit: 2024-03-03 | Discharge: 2024-03-03 | Disposition: A | Discharge status: Home / Self Care | Attending: Emergency Medicine | Admitting: Emergency Medicine

## 2024-03-03 ENCOUNTER — Other Ambulatory Visit: Payer: Self-pay

## 2024-03-03 ENCOUNTER — Emergency Department (HOSPITAL_BASED_OUTPATIENT_CLINIC_OR_DEPARTMENT_OTHER)

## 2024-03-03 ENCOUNTER — Encounter (HOSPITAL_BASED_OUTPATIENT_CLINIC_OR_DEPARTMENT_OTHER): Payer: Self-pay | Admitting: Emergency Medicine

## 2024-03-03 DIAGNOSIS — R197 Diarrhea, unspecified: Secondary | ICD-10-CM | POA: Diagnosis not present

## 2024-03-03 DIAGNOSIS — R1084 Generalized abdominal pain: Secondary | ICD-10-CM | POA: Insufficient documentation

## 2024-03-03 DIAGNOSIS — R112 Nausea with vomiting, unspecified: Secondary | ICD-10-CM | POA: Diagnosis not present

## 2024-03-03 LAB — COMPREHENSIVE METABOLIC PANEL WITH GFR
ALT: 34 U/L (ref 0–44)
AST: 27 U/L (ref 15–41)
Albumin: 4.4 g/dL (ref 3.5–5.0)
Alkaline Phosphatase: 99 U/L (ref 38–126)
Anion gap: 10 (ref 5–15)
BUN: 7 mg/dL (ref 6–20)
CO2: 25 mmol/L (ref 22–32)
Calcium: 9.4 mg/dL (ref 8.9–10.3)
Chloride: 103 mmol/L (ref 98–111)
Creatinine, Ser: 0.64 mg/dL (ref 0.44–1.00)
GFR, Estimated: >60 mL/min (ref >60)
Glucose, Bld: 92 mg/dL (ref 70–99)
Potassium: 3.9 mmol/L (ref 3.5–5.1)
Sodium: 139 mmol/L (ref 135–145)
Total Bilirubin: 0.2 mg/dL (ref 0.0–1.2)
Total Protein: 7 g/dL (ref 6.5–8.1)

## 2024-03-03 LAB — URINALYSIS, ROUTINE W REFLEX MICROSCOPIC
Bilirubin Urine: NEGATIVE
Glucose, UA: NEGATIVE mg/dL
Hgb urine dipstick: NEGATIVE
Ketones, ur: NEGATIVE mg/dL
Nitrite: NEGATIVE
Protein, ur: NEGATIVE mg/dL
Specific Gravity, Urine: 1.016 (ref 1.005–1.030)
pH: 6.5 (ref 5.0–8.0)

## 2024-03-03 LAB — PREGNANCY, URINE: Preg Test, Ur: NEGATIVE

## 2024-03-03 LAB — CBC
HCT: 38.7 % (ref 36.0–46.0)
Hemoglobin: 13.1 g/dL (ref 12.0–15.0)
MCH: 30.1 pg (ref 26.0–34.0)
MCHC: 33.9 g/dL (ref 30.0–36.0)
MCV: 89 fL (ref 80.0–100.0)
Platelets: 291 10*3/uL (ref 150–400)
RBC: 4.35 MIL/uL (ref 3.87–5.11)
RDW: 12.3 % (ref 11.5–15.5)
WBC: 4.8 10*3/uL (ref 4.0–10.5)
nRBC: 0 % (ref 0.0–0.2)

## 2024-03-03 LAB — LIPASE, BLOOD: Lipase: 32 U/L (ref 11–51)

## 2024-03-03 MED ORDER — IOHEXOL 300 MG/ML  SOLN
100.0000 mL | Freq: Once | INTRAMUSCULAR | Status: AC | PRN
  Administered 2024-03-03: 80 mL via INTRAVENOUS

## 2024-03-03 MED ORDER — OMEPRAZOLE 20 MG PO CPDR: 20.0000 mg | DELAYED_RELEASE_CAPSULE | Freq: Two times a day (BID) | ORAL | 0 refills | Status: AC

## 2024-03-03 MED ORDER — SUCRALFATE 1 G PO TABS: 1.0000 g | ORAL_TABLET | Freq: Three times a day (TID) | ORAL | 0 refills | Status: AC

## 2024-03-03 MED ORDER — ONDANSETRON 8 MG PO TBDP: 8.0000 mg | ORAL_TABLET | Freq: Three times a day (TID) | ORAL | 0 refills | Status: AC | PRN

## 2024-03-03 NOTE — ED Triage Notes (Signed)
 C/o R side ABD pain and n/v/d since Friday. Also reports chills. Seen at [facility].

## 2024-03-03 NOTE — ED Provider Notes (Signed)
 Tiffany Davidson EMERGENCY DEPARTMENT AT [facility] Provider Note   CSN: [id number] Arrival date & time: 03/03/24  1248     History {Add pertinent medical, surgical, social history, OB history to HPI:1} Chief Complaint  Patient presents with   Abdominal Pain    Tiffany Davidson is a 28 y.o. female.  HPI    28 year old female comes in with chief complaint of abdominal pain  Home Medications Prior to Admission medications   Medication Sig Start Date End Date Taking? Authorizing Provider  omeprazole (PRILOSEC) 20 MG capsule Take 1 capsule (20 mg total) by mouth 2 (two) times daily before a meal. 03/03/24  Yes Tiffany Kamer, MD  ondansetron  (ZOFRAN -ODT) 8 MG disintegrating tablet Take 1 tablet (8 mg total) by mouth every 8 (eight) hours as needed for nausea. 03/03/24  Yes Tiffany Face, MD  sucralfate (CARAFATE) 1 g tablet Take 1 tablet (1 g total) by mouth 4 (four) times daily -  with meals and at bedtime. 03/03/24  Yes Tiffany Face, MD  hydrOXYzine  (ATARAX /VISTARIL ) 25 MG tablet Take 1 tablet (25 mg total) by mouth 3 (three) times daily as needed for anxiety. 07/08/19   Tiffany Chroman, NP  ibuprofen  (ADVIL ) 600 MG tablet Take 1 tablet (600 mg total) by mouth every 6 (six) hours. Do not take after 28 weeks. 01/21/23   Smith, Tiffany Davidson , CNM  polyethylene glycol powder (GLYCOLAX /MIRALAX ) 17 GM/SCOOP powder Take 17 g by mouth daily as needed for moderate constipation. 01/21/23   Smith, Tiffany Davidson , CNM  Prenatal Vit-Fe Fumarate-FA (PRENATAL MULTIVITAMIN) TABS tablet Take 1 tablet by mouth daily at 12 noon. 07/08/19   Tiffany Chroman, NP  sertraline  (ZOLOFT ) 50 MG tablet Take 1 tablet (50 mg total) by mouth daily. 07/09/19   Tiffany Chroman, NP  traMADol  (ULTRAM ) 50 MG tablet Take 1-2 tablets (50-100 mg total) by mouth every 6 (six) hours as needed. 01/21/23   Tiffany Davidson , CNM      Allergies    Patient has no known allergies.    Review of Systems   Review of Systems  Physical  Exam Updated Vital Signs BP 111/83   Pulse 64   Temp 98.6 F (37 C)   Resp 16   SpO2 100%  Physical Exam  ED Results / Procedures / Treatments   Labs (all labs ordered are listed, but only abnormal results are displayed) Labs Reviewed  URINALYSIS, ROUTINE W REFLEX MICROSCOPIC - Abnormal; Notable for the following components:      Result Value   Leukocytes,Ua LARGE (*)    Bacteria, UA RARE (*)    All other components within normal limits  LIPASE, BLOOD  COMPREHENSIVE METABOLIC PANEL WITH GFR  CBC  PREGNANCY, URINE    EKG None  Radiology CT ABDOMEN PELVIS W CONTRAST Result Date: 03/03/2024 CLINICAL DATA:  RLQ abdominal pain EXAM: CT ABDOMEN AND PELVIS WITH CONTRAST TECHNIQUE: Multidetector CT imaging of the abdomen and pelvis was performed using the standard protocol following bolus administration of intravenous contrast. RADIATION DOSE REDUCTION: This exam was performed according to the departmental dose-optimization program which includes automated exposure control, adjustment of the mA and/or kV according to patient size and/or use of iterative reconstruction technique. CONTRAST:  80mL OMNIPAQUE IOHEXOL 300 MG/ML  SOLN COMPARISON:  July 13, 2022 FINDINGS: Lower chest: No focal airspace consolidation or pleural effusion. Hepatobiliary: The most superior aspect of the right hepatic dome is excluded from the field of view. No mass. Focal fatty infiltration along the falciform ligament. No  radiopaque stones or wall thickening of the gallbladder. No intrahepatic or extrahepatic biliary ductal dilation. The portal veins are patent. Pancreas: No mass or main ductal dilation. No peripancreatic inflammation or fluid collection. Spleen: Normal size. No mass. Adrenals/Urinary Tract: No adrenal masses. No renal mass. A couple of small nonobstructive bilateral calyceal calculi. The urinary bladder is distended without focal abnormality. Stomach/Bowel: The stomach contains ingested material  without focal abnormality. No small bowel wall thickening or inflammation. No small bowel obstruction.Normal appendix. Vascular/Lymphatic: No aortic aneurysm. No intraabdominal or pelvic lymphadenopathy. Reproductive: The uterus and ovaries are within normal limits for patient's age. Well-positioned intrauterine device in the upper endometrium. Corpus luteal cyst in the right ovary measuring 2.2 cm. Trace free fluid in the pelvis. Other: No pneumoperitoneum, ascites, or mesenteric inflammation. Musculoskeletal: No acute fracture or destructive lesion. IMPRESSION: 1. No acute intraabdominal or pelvic abnormality. 2. Small nonobstructive bilateral nephrolithiasis. No hydronephrosis in either kidney. 3. Corpus luteal cyst in the right ovary, measuring 2.2 cm. Trace free fluid in the pelvis, likely physiologic in a female of this age. Electronically Signed   By: Rance Burrows M.D.   On: 03/03/2024 16:30    Procedures Procedures  {Document cardiac monitor, telemetry assessment procedure when appropriate:1}  Medications Ordered in ED Medications  iohexol (OMNIPAQUE) 300 MG/ML solution 100 mL (80 mLs Intravenous Contrast Given 03/03/24 1603)    ED Course/ Medical Decision Making/ A&P   {   Click here for ABCD2, HEART and other calculatorsREFRESH Note before signing :1}                              Medical Decision Making Amount and/or Complexity of Data Reviewed Labs: ordered.  Risk Prescription drug management.   ***  {Document critical care time when appropriate:1} {Document review of labs and clinical decision tools ie heart score, Chads2Vasc2 etc:1}  {Document your independent review of radiology images, and any outside records:1} {Document your discussion with family members, caretakers, and with consultants:1} {Document social determinants of health affecting pt's care:1} {Document your decision making why or why not admission, treatments were needed:1} Final Clinical Impression(s) /  ED Diagnoses Final diagnoses:  Generalized abdominal pain    Rx / DC Orders ED Discharge Orders          Ordered    ondansetron  (ZOFRAN -ODT) 8 MG disintegrating tablet  Every 8 hours PRN        03/03/24 1910    omeprazole (PRILOSEC) 20 MG capsule  2 times daily before meals        03/03/24 1910    sucralfate (CARAFATE) 1 g tablet  3 times daily with meals & bedtime        03/03/24 1910

## 2024-03-03 NOTE — Discharge Instructions (Addendum)
 We saw you in the ER for abdominal discomfort. The results of our workup, including labs and imaging are reassuring at this time.   Please start taking the antacids and nausea medication.  Soft diet and clear liquid diet recommended until you get better.  Symptoms can evolve, therefore please return to the ER if you have increased pain, fevers, chills, inability to keep any medications down, confusion, sweating. Otherwise see your primary care doctor in 2-3 days for further evaluation.
